# Patient Record
Sex: Male | Born: 1935 | Race: Black or African American | Hispanic: No | Marital: Married | Smoking: Never smoker
Health system: Southern US, Community
[De-identification: ages and names within clinical notes are randomized; demographics above are authoritative.]

## PROBLEM LIST (undated history)

## (undated) HISTORY — PX: APPENDECTOMY: SHX54

---

## 2005-02-01 ENCOUNTER — Ambulatory Visit: Payer: Self-pay | Admitting: Radiation Oncology

## 2005-02-25 ENCOUNTER — Ambulatory Visit: Payer: Self-pay | Admitting: Radiation Oncology

## 2005-03-28 ENCOUNTER — Ambulatory Visit: Payer: Self-pay | Admitting: Radiation Oncology

## 2005-04-25 ENCOUNTER — Other Ambulatory Visit: Payer: Self-pay

## 2005-04-25 ENCOUNTER — Ambulatory Visit: Payer: Self-pay | Admitting: Urology

## 2005-04-27 ENCOUNTER — Ambulatory Visit: Payer: Self-pay | Admitting: Radiation Oncology

## 2005-05-08 ENCOUNTER — Ambulatory Visit: Payer: Self-pay | Admitting: Radiation Oncology

## 2005-05-28 ENCOUNTER — Ambulatory Visit: Payer: Self-pay | Admitting: Radiation Oncology

## 2005-06-28 ENCOUNTER — Ambulatory Visit: Payer: Self-pay | Admitting: Radiation Oncology

## 2005-09-18 ENCOUNTER — Ambulatory Visit: Payer: Self-pay | Admitting: Radiation Oncology

## 2005-10-04 ENCOUNTER — Inpatient Hospital Stay: Payer: Self-pay | Admitting: Surgery

## 2006-03-19 ENCOUNTER — Ambulatory Visit: Payer: Self-pay | Admitting: Radiation Oncology

## 2006-03-28 ENCOUNTER — Ambulatory Visit: Payer: Self-pay | Admitting: Radiation Oncology

## 2007-03-20 ENCOUNTER — Ambulatory Visit: Payer: Self-pay | Admitting: Radiation Oncology

## 2007-03-29 ENCOUNTER — Ambulatory Visit: Payer: Self-pay | Admitting: Radiation Oncology

## 2008-02-26 ENCOUNTER — Ambulatory Visit: Payer: Self-pay | Admitting: Radiation Oncology

## 2008-03-18 ENCOUNTER — Ambulatory Visit: Payer: Self-pay | Admitting: Radiation Oncology

## 2008-03-28 ENCOUNTER — Ambulatory Visit: Payer: Self-pay | Admitting: Radiation Oncology

## 2008-08-12 ENCOUNTER — Ambulatory Visit: Payer: Self-pay | Admitting: Family Medicine

## 2008-09-24 ENCOUNTER — Ambulatory Visit: Payer: Self-pay | Admitting: Internal Medicine

## 2009-02-25 ENCOUNTER — Ambulatory Visit: Payer: Self-pay | Admitting: Radiation Oncology

## 2009-03-17 ENCOUNTER — Ambulatory Visit: Payer: Self-pay | Admitting: Radiation Oncology

## 2009-03-28 ENCOUNTER — Ambulatory Visit: Payer: Self-pay | Admitting: Radiation Oncology

## 2010-02-25 ENCOUNTER — Ambulatory Visit: Payer: Self-pay | Admitting: Radiation Oncology

## 2010-03-12 ENCOUNTER — Ambulatory Visit: Payer: Self-pay | Admitting: Radiation Oncology

## 2010-03-28 ENCOUNTER — Ambulatory Visit: Payer: Self-pay | Admitting: Radiation Oncology

## 2011-04-02 ENCOUNTER — Inpatient Hospital Stay: Payer: Self-pay | Admitting: Internal Medicine

## 2011-04-03 ENCOUNTER — Inpatient Hospital Stay (HOSPITAL_COMMUNITY)
Admission: RE | Admit: 2011-04-03 | Discharge: 2011-04-10 | DRG: 236 | Disposition: A | Discharge status: Home Health | Payer: Medicare Other | Source: Other Acute Inpatient Hospital | Attending: Thoracic Surgery (Cardiothoracic Vascular Surgery) | Admitting: Thoracic Surgery (Cardiothoracic Vascular Surgery)

## 2011-04-03 ENCOUNTER — Inpatient Hospital Stay (HOSPITAL_COMMUNITY): Payer: Medicare Other

## 2011-04-03 DIAGNOSIS — Z8546 Personal history of malignant neoplasm of prostate: Secondary | ICD-10-CM

## 2011-04-03 DIAGNOSIS — F05 Delirium due to known physiological condition: Secondary | ICD-10-CM | POA: Diagnosis not present

## 2011-04-03 DIAGNOSIS — E785 Hyperlipidemia, unspecified: Secondary | ICD-10-CM | POA: Diagnosis present

## 2011-04-03 DIAGNOSIS — D72829 Elevated white blood cell count, unspecified: Secondary | ICD-10-CM | POA: Diagnosis not present

## 2011-04-03 DIAGNOSIS — E8779 Other fluid overload: Secondary | ICD-10-CM | POA: Diagnosis present

## 2011-04-03 DIAGNOSIS — D62 Acute posthemorrhagic anemia: Secondary | ICD-10-CM | POA: Diagnosis not present

## 2011-04-03 DIAGNOSIS — I2510 Atherosclerotic heart disease of native coronary artery without angina pectoris: Principal | ICD-10-CM | POA: Diagnosis present

## 2011-04-03 DIAGNOSIS — Z7982 Long term (current) use of aspirin: Secondary | ICD-10-CM

## 2011-04-03 DIAGNOSIS — R000 Tachycardia, unspecified: Secondary | ICD-10-CM | POA: Diagnosis not present

## 2011-04-03 DIAGNOSIS — R1313 Dysphagia, pharyngeal phase: Secondary | ICD-10-CM | POA: Diagnosis not present

## 2011-04-03 DIAGNOSIS — Z01810 Encounter for preprocedural cardiovascular examination: Secondary | ICD-10-CM

## 2011-04-03 DIAGNOSIS — I209 Angina pectoris, unspecified: Secondary | ICD-10-CM | POA: Diagnosis present

## 2011-04-03 DIAGNOSIS — I10 Essential (primary) hypertension: Secondary | ICD-10-CM | POA: Diagnosis present

## 2011-04-03 DIAGNOSIS — Z79899 Other long term (current) drug therapy: Secondary | ICD-10-CM

## 2011-04-03 LAB — COMPREHENSIVE METABOLIC PANEL
ALT: 8 U/L (ref 0–53)
AST: 16 U/L (ref 0–37)
Albumin: 3.5 g/dL (ref 3.5–5.2)
Calcium: 9.3 mg/dL (ref 8.4–10.5)
Creatinine, Ser: 0.94 mg/dL (ref 0.4–1.5)
GFR calc Af Amer: >60 mL/min (ref >60)
GFR calc non Af Amer: >60 mL/min (ref >60)
Sodium: 138 mEq/L (ref 135–145)
Total Protein: 7 g/dL (ref 6.0–8.3)

## 2011-04-03 LAB — BLOOD GAS, ARTERIAL
Bicarbonate: 26.6 mEq/L — ABNORMAL HIGH (ref 20.0–24.0)
FIO2: 0.21 %
Patient temperature: 98.6
TCO2: 27.9 mmol/L (ref 0–100)
pCO2 arterial: 42 mmHg (ref 35.0–45.0)
pH, Arterial: 7.418 (ref 7.350–7.450)

## 2011-04-03 LAB — URINALYSIS, MICROSCOPIC ONLY
Bilirubin Urine: NEGATIVE
Ketones, ur: NEGATIVE mg/dL
Nitrite: NEGATIVE
Protein, ur: NEGATIVE mg/dL
pH: 5.5 (ref 5.0–8.0)

## 2011-04-03 LAB — PROTIME-INR
INR: 1.01 (ref 0.00–1.49)
Prothrombin Time: 13.5 seconds (ref 11.6–15.2)

## 2011-04-03 LAB — CBC
Platelets: 238 10*3/uL (ref 150–400)
RBC: 4.89 MIL/uL (ref 4.22–5.81)
RDW: 13 % (ref 11.5–15.5)
WBC: 11.4 10*3/uL — ABNORMAL HIGH (ref 4.0–10.5)

## 2011-04-03 LAB — DIFFERENTIAL
Basophils Absolute: 0 10*3/uL (ref 0.0–0.1)
Eosinophils Absolute: 0.1 10*3/uL (ref 0.0–0.7)
Eosinophils Relative: 1 % (ref 0–5)
Lymphocytes Relative: 15 % (ref 12–46)
Lymphs Abs: 1.7 10*3/uL (ref 0.7–4.0)
Neutrophils Relative %: 76 % (ref 43–77)

## 2011-04-03 LAB — HEMOGLOBIN A1C: Mean Plasma Glucose: 123 mg/dL — ABNORMAL HIGH (ref <117)

## 2011-04-03 LAB — APTT: aPTT: 36 seconds (ref 24–37)

## 2011-04-04 ENCOUNTER — Inpatient Hospital Stay (HOSPITAL_COMMUNITY): Payer: Medicare Other

## 2011-04-04 DIAGNOSIS — I2510 Atherosclerotic heart disease of native coronary artery without angina pectoris: Secondary | ICD-10-CM

## 2011-04-04 LAB — CBC
HCT: 31.2 % — ABNORMAL LOW (ref 39.0–52.0)
HCT: 32.1 % — ABNORMAL LOW (ref 39.0–52.0)
Hemoglobin: 10.8 g/dL — ABNORMAL LOW (ref 13.0–17.0)
Hemoglobin: 11.1 g/dL — ABNORMAL LOW (ref 13.0–17.0)
MCHC: 34.6 g/dL (ref 30.0–36.0)
MCV: 82.3 fL (ref 78.0–100.0)
RDW: 13.2 % (ref 11.5–15.5)
WBC: 15.3 10*3/uL — ABNORMAL HIGH (ref 4.0–10.5)

## 2011-04-04 LAB — HEMOGLOBIN AND HEMATOCRIT, BLOOD: Hemoglobin: 8.6 g/dL — ABNORMAL LOW (ref 13.0–17.0)

## 2011-04-04 LAB — POCT I-STAT, CHEM 8
BUN: 9 mg/dL (ref 6–23)
Calcium, Ion: 1.18 mmol/L (ref 1.12–1.32)
Chloride: 107 mEq/L (ref 96–112)
Creatinine, Ser: 1.1 mg/dL (ref 0.4–1.5)
Glucose, Bld: 138 mg/dL — ABNORMAL HIGH (ref 70–99)
Potassium: 4.4 mEq/L (ref 3.5–5.1)

## 2011-04-04 LAB — POCT I-STAT 3, ART BLOOD GAS (G3+)
Acid-base deficit: 4 mmol/L — ABNORMAL HIGH (ref 0.0–2.0)
Bicarbonate: 27.7 mEq/L — ABNORMAL HIGH (ref 20.0–24.0)
Bicarbonate: 21.7 mEq/L (ref 20.0–24.0)
O2 Saturation: 100 %
O2 Saturation: 99 %
TCO2: 23 mmol/L (ref 0–100)
pH, Arterial: 7.424 (ref 7.350–7.450)
pO2, Arterial: 235 mmHg — ABNORMAL HIGH (ref 80.0–100.0)
pO2, Arterial: 194 mmHg — ABNORMAL HIGH (ref 80.0–100.0)
pO2, Arterial: 165 mmHg — ABNORMAL HIGH (ref 80.0–100.0)

## 2011-04-04 LAB — POCT I-STAT 4, (NA,K, GLUC, HGB,HCT)
Glucose, Bld: 103 mg/dL — ABNORMAL HIGH (ref 70–99)
Glucose, Bld: 97 mg/dL (ref 70–99)
Glucose, Bld: 107 mg/dL — ABNORMAL HIGH (ref 70–99)
HCT: 26 % — ABNORMAL LOW (ref 39.0–52.0)
HCT: 26 % — ABNORMAL LOW (ref 39.0–52.0)
HCT: 24 % — ABNORMAL LOW (ref 39.0–52.0)
Hemoglobin: 10.5 g/dL — ABNORMAL LOW (ref 13.0–17.0)
Hemoglobin: 8.8 g/dL — ABNORMAL LOW (ref 13.0–17.0)
Hemoglobin: 10.5 g/dL — ABNORMAL LOW (ref 13.0–17.0)
Potassium: 3.3 mEq/L — ABNORMAL LOW (ref 3.5–5.1)
Potassium: 3.2 mEq/L — ABNORMAL LOW (ref 3.5–5.1)
Potassium: 4.5 mEq/L (ref 3.5–5.1)
Sodium: 135 mEq/L (ref 135–145)
Sodium: 136 mEq/L (ref 135–145)
Sodium: 137 mEq/L (ref 135–145)

## 2011-04-04 LAB — PROTIME-INR: INR: 1.48 (ref 0.00–1.49)

## 2011-04-04 LAB — APTT: aPTT: 39 seconds — ABNORMAL HIGH (ref 24–37)

## 2011-04-05 ENCOUNTER — Inpatient Hospital Stay (HOSPITAL_COMMUNITY): Payer: Medicare Other

## 2011-04-05 LAB — BASIC METABOLIC PANEL
BUN: 20 mg/dL (ref 6–23)
CO2: 22 mEq/L (ref 19–32)
CO2: 24 mEq/L (ref 19–32)
Calcium: 8.1 mg/dL — ABNORMAL LOW (ref 8.4–10.5)
Calcium: 8.9 mg/dL (ref 8.4–10.5)
Chloride: 101 mEq/L (ref 96–112)
Creatinine, Ser: 0.94 mg/dL (ref 0.4–1.5)
Creatinine, Ser: 1.35 mg/dL (ref 0.4–1.5)
GFR calc Af Amer: >60 mL/min (ref >60)
GFR calc non Af Amer: >60 mL/min (ref >60)
Glucose, Bld: 156 mg/dL — ABNORMAL HIGH (ref 70–99)
Sodium: 137 mEq/L (ref 135–145)

## 2011-04-05 LAB — CBC
HCT: 29.4 % — ABNORMAL LOW (ref 39.0–52.0)
Hemoglobin: 9.9 g/dL — ABNORMAL LOW (ref 13.0–17.0)
Hemoglobin: 10 g/dL — ABNORMAL LOW (ref 13.0–17.0)
MCH: 27.9 pg (ref 26.0–34.0)
MCH: 27.9 pg (ref 26.0–34.0)
MCHC: 33.8 g/dL (ref 30.0–36.0)
MCV: 82.1 fL (ref 78.0–100.0)
Platelets: 157 10*3/uL (ref 150–400)
Platelets: 168 10*3/uL (ref 150–400)
RBC: 3.55 MIL/uL — ABNORMAL LOW (ref 4.22–5.81)
RBC: 3.58 MIL/uL — ABNORMAL LOW (ref 4.22–5.81)
WBC: 21.8 10*3/uL — ABNORMAL HIGH (ref 4.0–10.5)

## 2011-04-05 LAB — GLUCOSE, CAPILLARY
Glucose-Capillary: 101 mg/dL — ABNORMAL HIGH (ref 70–99)
Glucose-Capillary: 104 mg/dL — ABNORMAL HIGH (ref 70–99)
Glucose-Capillary: 117 mg/dL — ABNORMAL HIGH (ref 70–99)
Glucose-Capillary: 119 mg/dL — ABNORMAL HIGH (ref 70–99)
Glucose-Capillary: 132 mg/dL — ABNORMAL HIGH (ref 70–99)
Glucose-Capillary: 81 mg/dL (ref 70–99)

## 2011-04-05 LAB — MAGNESIUM
Magnesium: 2.6 mg/dL — ABNORMAL HIGH (ref 1.5–2.5)
Magnesium: 2.8 mg/dL — ABNORMAL HIGH (ref 1.5–2.5)

## 2011-04-06 ENCOUNTER — Inpatient Hospital Stay (HOSPITAL_COMMUNITY): Payer: Medicare Other

## 2011-04-06 LAB — BASIC METABOLIC PANEL
Calcium: 8 mg/dL — ABNORMAL LOW (ref 8.4–10.5)
GFR calc Af Amer: >60 mL/min (ref >60)
GFR calc non Af Amer: 57 mL/min — ABNORMAL LOW (ref >60)
Potassium: 4.1 mEq/L (ref 3.5–5.1)
Sodium: 135 mEq/L (ref 135–145)

## 2011-04-06 LAB — CBC
Hemoglobin: 8.9 g/dL — ABNORMAL LOW (ref 13.0–17.0)
MCH: 28.3 pg (ref 26.0–34.0)
MCHC: 34.5 g/dL (ref 30.0–36.0)
Platelets: 130 10*3/uL — ABNORMAL LOW (ref 150–400)

## 2011-04-06 LAB — GLUCOSE, CAPILLARY: Glucose-Capillary: 117 mg/dL — ABNORMAL HIGH (ref 70–99)

## 2011-04-07 ENCOUNTER — Inpatient Hospital Stay (HOSPITAL_COMMUNITY): Payer: Medicare Other

## 2011-04-07 LAB — CROSSMATCH
Antibody Screen: NEGATIVE
Unit division: 0

## 2011-04-07 LAB — BASIC METABOLIC PANEL
CO2: 26 mEq/L (ref 19–32)
Calcium: 8.3 mg/dL — ABNORMAL LOW (ref 8.4–10.5)
Creatinine, Ser: 1.12 mg/dL (ref 0.4–1.5)
GFR calc Af Amer: >60 mL/min (ref >60)
GFR calc non Af Amer: >60 mL/min (ref >60)
Sodium: 141 mEq/L (ref 135–145)

## 2011-04-07 LAB — CBC
MCV: 81.6 fL (ref 78.0–100.0)
Platelets: 187 10*3/uL (ref 150–400)
RBC: 3.26 MIL/uL — ABNORMAL LOW (ref 4.22–5.81)
RDW: 13.8 % (ref 11.5–15.5)
WBC: 18.4 10*3/uL — ABNORMAL HIGH (ref 4.0–10.5)

## 2011-04-08 LAB — URINE CULTURE
Colony Count: NO GROWTH
Culture  Setup Time: 201206102033

## 2011-04-08 LAB — URINALYSIS, ROUTINE W REFLEX MICROSCOPIC
Leukocytes, UA: NEGATIVE
Nitrite: NEGATIVE
Specific Gravity, Urine: 1.029 (ref 1.005–1.030)
Urobilinogen, UA: 1 mg/dL (ref 0.0–1.0)

## 2011-04-08 LAB — URINE MICROSCOPIC-ADD ON

## 2011-04-08 LAB — CBC
HCT: 27.3 % — ABNORMAL LOW (ref 39.0–52.0)
MCH: 28.1 pg (ref 26.0–34.0)
MCV: 82.5 fL (ref 78.0–100.0)
RBC: 3.31 MIL/uL — ABNORMAL LOW (ref 4.22–5.81)
RDW: 13.8 % (ref 11.5–15.5)
WBC: 13.8 10*3/uL — ABNORMAL HIGH (ref 4.0–10.5)

## 2011-04-09 ENCOUNTER — Inpatient Hospital Stay (HOSPITAL_COMMUNITY): Payer: Medicare Other

## 2011-04-09 LAB — CBC
Platelets: 301 10*3/uL (ref 150–400)
RDW: 14.3 % (ref 11.5–15.5)
WBC: 12.5 10*3/uL — ABNORMAL HIGH (ref 4.0–10.5)

## 2011-04-09 LAB — BASIC METABOLIC PANEL
Chloride: 106 mEq/L (ref 96–112)
GFR calc Af Amer: >60 mL/min (ref >60)
Potassium: 3.7 mEq/L (ref 3.5–5.1)

## 2011-04-24 HISTORY — PX: CORONARY ARTERY BYPASS GRAFT: SHX141

## 2011-04-30 ENCOUNTER — Other Ambulatory Visit: Payer: Self-pay | Admitting: Thoracic Surgery (Cardiothoracic Vascular Surgery)

## 2011-04-30 ENCOUNTER — Ambulatory Visit: Payer: Self-pay | Admitting: Family Medicine

## 2011-04-30 DIAGNOSIS — I2510 Atherosclerotic heart disease of native coronary artery without angina pectoris: Secondary | ICD-10-CM

## 2011-04-30 NOTE — Discharge Summary (Signed)
Jimmy Horn, Jimmy Horn               ACCOUNT NO.:  [id number]  MEDICAL RECORD NO.:  [id number]  LOCATION:  2015                         FACILITY:  [facility]  PHYSICIAN:  Salvatore Decent. Dorris Fetch, M.D.DATE OF BIRTH:  1931/01/15  DATE OF ADMISSION:  04/03/2011 DATE OF DISCHARGE:                              DISCHARGE SUMMARY   HISTORY:  The patient is a 77 year old male with a 60-month history of chest tightness, which would occur with exertion and was relieved with rest.  His symptoms have increased in frequency over time and are associated with mild shortness of breath, decreased exercise tolerance, and fatigue.  He denies any diaphoresis and nausea or vomiting associated with these symptoms.  He saw his primary care physician, Dr. Letta Kocher in [facility] recently with these complaints and subsequently underwent a stress test, which showed septal and distal apical perfusion defects consistent with myocardial ischemia.  He was then referred to Dr. Arnoldo Hooker at the [facility] and was admitted to [facility] for cardiac catheterization.  This was done on April 02, 2011 and revealed a 99% distal left main stenosis with a 50% proximal LAD and a 75% additional proximal LAD lesion as well as a 60% mid-LAD lesion, a 75% ostial circumflex lesion, and a 99% proximal right coronary artery lesion.  He was found to have normal left ventricular function.  Because of the severity of the left main disease and other lesions, it was felt that surgical revascularization was his best option and he was transferred to [facility] under the care of Dr. Charlett Lango for coronary artery bypass grafting.  PAST MEDICAL HISTORY: 1. Hypertension. 2. Hyperlipidemia. 3. History of prostate cancer, status post radiation therapy.  PAST SURGICAL HISTORY:  Open appendectomy.  MEDICATIONS PRIOR TO ADMISSION: 1. Simvastatin 20 mg q.p.m. 2. Lisinopril/hydrochlorothiazide 20/25 mg p.o.  daily. 3. Aspirin 81 mg p.o. daily.  ALLERGIES:  No known drug allergies.  FAMILY HISTORY:  Remarkable for cancer in several of his brothers.  His mother deceased at age 32 from myocardial infarction.  SOCIAL HISTORY:  He is retired and resides with his [place]  He denies alcohol or tobacco use.  He did previously smoked an occasional cigar, but has not done this for several years.  REVIEW OF SYMPTOMS:  Please see the dictated history and physical done at the time of admission.  PHYSICAL EXAMINATION:  Please see the dictated history and physical done at the time of admission.  HOSPITAL COURSE:  The patient was admitted in transfer and seen by Dr. Dorris Fetch who agreed with recommendations to proceed with surgical revascularization.  PROCEDURE:  On April 04, 2011, he was taken to the cardiac operating room where he underwent the following procedure; coronary artery bypass grafting x3.  The following grafts were placed. 1. Left internal mammary artery to the LAD. 2. Saphenous vein graft to the distal right coronary artery. 3. Saphenous vein graft to the obtuse marginal #1.  Intraoperative     findings included the fact that the vein was relatively large, but     without discrete varicosities.  The distal right was a fair quality     target.  The LAD and obtuse marginal were good quality targets.     Mammary was good quality and he had normal cardiac size and     function.  POSTOPERATIVE HOSPITAL COURSE:  The patient has overall progressed nicely postoperatively.  He has remained hemodynamically stable.  He was weaned from the ventilator without difficulty.  All routine lines, monitors and drainage devices have been discontinued in the standard fashion.  He has had some occasional postoperative confusion, requiring intermittent Haldol use.  This is slowly improved and he currently is at his baseline neurological status with the exception of some  difficulty with swallowing.  He is being seen by Speech Pathology.  Currently, he is on a dysphagia III diet.  He is scheduled to undergo a modified barium swallow on today's date, April 09, 2011.  Pending this result, we will determine his dietary recommendations for discharge.  He has postoperative acute blood loss anemia, which has stabilized.  His most recent hemoglobin and hematocrit dated April 08, 2011 are 9.3 and 27.3 respectively.  Incisions are healing well without evidence of infection. He has had no significant cardiac dysrhythmias.  He has had some hypertension and his medications have been adjusted, being restarted on ACE inhibitor and his beta-blocker is being titrated for both high heart rate and blood pressure.  He did have a postoperative leukocytosis, but has remained afebrile with no evidence of infection.  His values are also improving.  His most recent white blood cell count is 13.8 on April 08, 2011.  Oxygen has been weaned and he maintained good saturations on room air.  He has a mild postoperative volume overload, which is responding well with diuretics, which will be continued short-term as an outpatient.  Currently, his status is felt to be tentatively stable for discharge in the morning of April 10, 2011, pending morning round reevaluation.  Medications at discharge include the following: 1. Aspirin enteric-coated tablet 325 mg p.o. daily. 2. Ensure pudding p.o. t.i.d. 3. Lasix 40 mg daily for 5 additional days. 4. Robitussin DM p.r.n. q.4 h. 5. Lisinopril 10 mg p.o. daily. 6. Lopressor 50 mg p.o. b.i.d. 7. Potassium chloride 20 mEq daily for an additional 5 days for pain. 8. Ultram 50 mg one to two every 4-6 hours as needed. 9. Simvastatin 20 mg p.o. daily.  INSTRUCTIONS:  The patient will receive written instructions regarding medications, activity, diet, wound care, and followup.  FOLLOWUP:  Dr. Dorris Fetch on May 02, 2011 at 1:15.  Additionally, he has  instructions to obtain appointment with Dr. Gwen Pounds, his cardiologist in 2 weeks postdischarge.  DIET ON DISCHARGE:  Recommendations to follow pending resolve of the modified barium swallow to be done today.  FINAL DIAGNOSIS:  Severe left main and three-vessel coronary artery disease, now status post surgical revascularization as described.  OTHER DIAGNOSES: 1. Postoperative acute blood loss anemia. 2. Postoperative volume overload. 3. History of hypertension. 4. History of hyperlipidemia. 5. History of prostate cancer, status post radiation therapy. 6. Postoperative delirium, resolved. 7. Postoperative swallowing dysfunction.  Diagnosis still being     evaluated.     Rowe Clack, P.A.-C.   ______________________________ Salvatore Decent Dorris Fetch, M.D.    Sherryll Burger  D:  04/09/2011  T:  04/09/2011  Job:  [id number]  cc:   Lillie Columbia, MD  Electronically Signed by Gershon Crane P.A.-C. on 04/10/2011 09:32:36 AM Electronically Signed by Charlett Lango M.D. on 04/30/2011 01:49:32 PM

## 2011-04-30 NOTE — Op Note (Signed)
Jimmy Horn, Jimmy Horn               ACCOUNT NO.:  [id number]  MEDICAL RECORD NO.:  [id number]  LOCATION:  2306                         FACILITY:  [facility]  PHYSICIAN:  Salvatore Decent. Dorris Fetch, M.D.DATE OF BIRTH:  November 05, 1930  DATE OF PROCEDURE:  04/04/2011 DATE OF DISCHARGE:                              OPERATIVE REPORT   PREOPERATIVE DIAGNOSIS:  Critical left main and 3-vessel disease with exertional angina.  POSTOPERATIVE DIAGNOSIS:  Critical left main and 3-vessel disease with exertional angina.  PROCEDURES:  Median sternotomy, extracorporeal circulation, coronary bypass grafting x3 (left internal mammary artery to LAD, saphenous vein graft to distal right coronary artery, saphenous vein graft to obtuse marginal 1), endoscopic vein harvest right leg.  SURGEON:  Salvatore Decent. Dorris Fetch, MD  ASSISTANT:  Stephanie Acre Dasovich, PA  ANESTHESIA:  General.  FINDINGS:  Vein relatively large, but without discreet varicosities. Distal right fair-quality target LAD, OM good-quality target, mammary good quality, normal cardiac size and function.  CLINICAL NOTE:  Jimmy Horn is a 77 year old gentleman who presented with history of exertional angina.  This was relieved with rest.  He had had no rest or nocturnal or other unstable symptoms.  He was seen by Dr. Arnoldo Hooker and had cardiac catheterization performed where he was found to have a 99% left main stenosis as well as almost subtotal occlusion of the proximal right coronary artery.  The patient was referred to [facility] for coronary artery bypass grafting.  The indications, risks, benefits and alternatives were discussed in detail with the patient.  The patient understood the risks include, but not were not limited to death, stroke, MI, DVT, PE bleeding, possible need for transfusions, infections as well as other organ system dysfunction, respiratory, renal or GI complications.  He understood and accepted these risks, and agreed to  proceed with surgery.  OPERATIVE NOTE:  Jimmy Horn was brought to brought to the preoperative holding area on April 04, 2011.  There, the Anesthesia Service placed a Swan-Ganz catheter and arterial blood pressure monitoring line. Additional intravenous access was established.  Intravenous antibiotics were administered.  He was taken to the operating room, anesthetized and intubated.  A Foley catheter was placed.  The chest, abdomen, and legs were prepped and draped in usual sterile fashion.  An incision was made in the medial aspect of the left leg at the level of the knee.  The greater saphenous vein was harvested from groin to the upper calf.  It was a large caliber vein approximately 7 mm in diameter.  There were no discrete varicosities and no areas of sclerosis.  Simultaneously with this, a median sternotomy was performed and the left internal mammary artery was harvested using standard technique.  Internal mammary artery was a good-quality vessel.  Two thousand units of heparin was administered during the vessel harvest and remaining full heparin dose was given prior to opening the pericardium.  The pericardium was opened confirming adequate anticoagulation with ACT measurement.  The ascending aorta was inspected.  There was no palpable atherosclerotic disease.  The aorta was cannulated via concentric 2-0 Ethilon pledgeted pursestring sutures.  A dual stage venous cannula placed via pursestring suture in the rectal appendage.  Cardiopulmonary  bypass was instituted and the patient was cooled to 32 degrees Celsius. The coronary arteries were inspected and anastomotic sites were chosen. The conduits were inspected and cut to length.  A foam pad was placed in the pericardium to insulate the heart and protect the left phrenic nerve.  A temperature probe was placed in myocardial septum and a cardioplegic cannula placed in the ascending aorta.  The aorta was crossclamped.  The left  ventricle was emptied via aortic root vent.  Cardiac arrest then was achieved with a combination of cold antegrade blood cardioplegia and topical iced saline.  One liter of cardioplegia was administered.  Myocardial septal temperature fell to 12 degrees Celsius.  Following, distal anastomoses were performed.  First, a reverse saphenous vein graft was placed end-to-side to the distal right coronary artery.  This was a 1.5-mm target vessel.  There was vein to arterial size mismatch.  The vein was anastomosed end-to- side with a running 7-0 Prolene suture.  At the completion of each distal anastomosis, it was probed proximally and distally to ensure patency before tying the suture.  Cardioplegia was administered down each vein graft to assess flow and hemostasis.  Vein graft down the right coronary vein was fair.  Next, a segment of reversed saphenous vein was placed end-to-side to obtuse marginal 1.  He also had some lateral plaquing at the site of anastomosis, but overall was a good-quality vessel.  A 1.5-mm probe passed easily proximally and distally.  The vein graft was anastomosed end-to-side with a running 7-0 Prolene suture.  Again, there was vein to arterial size mismatch due to the overall size of the vein. Cardioplegia was administered down the vein graft.  There was good hemostasis at both.  Next, the left internal mammary artery was brought through a window in the pericardium.  The distal end was beveled and anastomosed end-to-side to the distal LAD.  The LAD was a 1.5-mm good-quality target.  The mammary was a 1.5-mm good-quality conduit.  Anastomosis was performed end-to-side with a running 8-0 Prolene suture.  At completion of the mammary to LAD anastomosis, the bulldog clamp was briefly removed to inspect for hemostasis.  Immediate rapid septal rewarming was noted. The bulldog clamp was replaced and the mammary pedicle was tacked.  The epicardial surface was harvested  with 6-0 Prolene sutures.  Additional cardioplegia was administered.  The vein grafts were cut to length.  Cardioplegic cannula then was removed from the ascending aorta. The proximal vein graft anastomoses were performed to 5.0-mm punch aortotomies with running 6-0 Prolene sutures.  At completion of all proximal anastomosis, the patient was placed in Trendelenburg position. Lidocaine was administered.  The aortic root was de-aired.  Bulldog clamps again removed from left mammary artery and aortic crossclamp was removed.  Total crossclamp time was 61 minutes.  The patient required single defibrillation with 10 joules and then was in heart block after that.  While rewarming was completed, all proximal and distal anastomoses were inspect for hemostasis.  Epicardial pacing wires were placed on the right ventricle and right atrium.  DDD pacing was initiated.  When the patient rewarmed to a core temperature of 37 degrees Celsius, the lungs were reinflated and the patient was placed on ventilator.  He was weaned from cardiopulmonary bypass on the first attempt.  Total bypass time was 95 minutes.  Initial cardiac index was greater than 2 liters per minute per meter squared and the patient remained hemodynamically stable throughout the post bypass period.  The test dose of protamine was administered and was well tolerated.  The atrial and aortic cannulae were removed.  The remainder of protamine was administered without incident.  The chest was irrigated with warm saline.  Hemostasis was achieved.  The pericardium was reapproximated over the aorta.  The left pleural and single mediastinal chest tube were placed through separate subcostal incisions.  The sternum was closed with a combination of single and double heavy gauge stainless steel wires.  The pectoralis fascia, subcutaneous tissue, and skin were closed in standard fashion.  All sponge, needle and sponge counts were correct at the  end of procedure.  The patient was taken from the operating room to the surgical intensive care unit in good condition.     Salvatore Decent Dorris Fetch, M.D.     SCH/MEDQ  D:  04/04/2011  T:  04/05/2011  Job:  [id number]  cc:   Arnoldo Hooker, MD  Electronically Signed by Charlett Lango M.D. on 04/30/2011 01:49:24 PM

## 2011-04-30 NOTE — Discharge Summary (Signed)
  NAMEKENNIETH, PLOTTS               ACCOUNT NO.:  [id number]  MEDICAL RECORD NO.:  [id number]  LOCATION:  2017                         FACILITY:  [facility]  PHYSICIAN:  Salvatore Decent. Dorris Fetch, M.D.DATE OF BIRTH:  08-Nov-1930  DATE OF ADMISSION:  04/03/2011 DATE OF DISCHARGE:  04/10/2011                              DISCHARGE SUMMARY   ADDENDUM  Speech Pathology did a modified barium swallow on patient on April 09, 2011.  This demonstrated primary pharyngeal dysphagia resulting from anatomical differences.  They recommended the patient to continue on a D3 diet with honey-thick liquids and crushed meds.  They did come back and evaluate the patient on April 10, 2011, and discussed with the patient's wife and daughter on how to thicken to honey and where to buy Thick-it.  They also discussed and instructed wife and daughter on when and how to schedule outpatient MBS in 4-6 weeks.  This information was given to them by Speech Pathology.  On morning rounds of April 10, 2011, the patient was noted to be afebrile, in normal sinus rhythm.  Blood pressure better controlled.  O2 sats greater than 90% on room air.  All incisions are clean, dry, and intact and healing well.  Case Management did discuss with family possibility of skilled nursing facility versus home with home health.  It was felt that the patient would do better at home with home health with wife and son and daughter.  The patient is felt to be ready for discharge to home today, April 10, 2011.  Please see dictated discharge summary for followup appointments and discharge instructions as well as discharge medications.     Sol Blazing, PA   ______________________________ Salvatore Decent Dorris Fetch, M.D.    KMD/MEDQ  D:  04/10/2011  T:  04/11/2011  Job:  [id number]  Electronically Signed by Cameron Proud PA on 04/11/2011 10:31:43 AM Electronically Signed by Charlett Lango M.D. on 04/30/2011 01:49:35 PM

## 2011-04-30 NOTE — H&P (Signed)
Jimmy Horn, Jimmy Horn               ACCOUNT NO.:  [id number]  MEDICAL RECORD NO.:  [id number]  LOCATION:  2399                         FACILITY:  [facility]  PHYSICIAN:  Salvatore Decent. Dorris Fetch, M.D.DATE OF BIRTH:  [birth year]  DATE OF ADMISSION:  04/03/2011 DATE OF DISCHARGE:                             HISTORY & PHYSICAL   CHIEF COMPLAINT:  Chest pain.  REASON FOR ADMISSION:  Severe left main and three-vessel coronary artery disease.  HISTORY OF PRESENT ILLNESS:  The patient is a 77 year old male with a 4- month history of chest tightness, which occurs with exertion and is relieved with rest.  His symptoms have increased in frequency over time and are associated with mild shortness of breath, decreased exercise tolerance, and fatigue.  He denies any diaphoresis, nausea or vomiting associated with these symptoms.  He saw his primary care physician, Dr. Letta Kocher in [facility] recently with these complaints and subsequently underwent a stress test, which showed septal and distal apical perfusion defects consistent with myocardial ischemia.  He was then referred to Dr. Arnoldo Hooker at [facility] and was admitted to [facility] for cardiac catheterization.  A catheterization was performed on April 02, 2011 and revealed a 99% distal left main stenosis along with 50% proximal LAD and a second 75% proximal LAD lesion, 60% mid LAD, 75% ostial circumflex, 99% proximal right coronary artery, and normal left ventricular function.  Because of his severe left main disease, it was felt that coronary artery bypass grafting would be his best option, and he was then transferred to [facility] under the care of Dr. Charlett Lango for cardiac surgery evaluation for potential CABG.  PAST MEDICAL HISTORY: 1. Hypertension. 2. Hyperlipidemia. 3. History of prostate cancer, status post radiation therapy.  PAST SURGICAL HISTORY:  Open appendectomy.  HOME MEDICATIONS: 1.  Simvastatin 20 mg nightly. 2. Lisinopril/hydrochlorothiazide 20/25 mg daily. 3. Aspirin 81 mg daily.  ALLERGIES:  No known drug allergies.  FAMILY HISTORY:  His mother died of an MI at age 50.  He also has a history of cancer in several brothers.  SOCIAL HISTORY:  He is retired and resides with his wife in [place].  He denies alcohol or tobacco use, although he previously smoked cigars occasionally, but has not been done from many years.  REVIEW OF SYSTEMS:  The patient did experience one episode of dizziness recently which resolved without intervention.  He did mention to his primary care physician during his workup for the chest pain and was told that this was nothing to worry about.  Otherwise, he denies fevers, chills, weight loss, recent infections, visual changes, TIA symptoms, amaurosis fugax, syncope, heart palpitations, cough, shortness of breath, dyspnea on exertion, orthopnea, PND, hemoptysis, sputum production, abdominal pain, nausea, vomiting, diarrhea, constipation,hematemesis, hematochezia, melena, dysuria, nocturia, or hematuria, lower extremity edema, lower extremity claudication symptoms, lower extremity ulcers, muscle pain, or weakness, anxiety, depression, intolerance to heat or cold.  PHYSICAL EXAMINATION:  VITAL SIGNS:  Blood pressure is 149/76, pulse is 70 and regular, respirations 18 and unlabored, temperature 97.8, O2 sat 97% on room air. GENERAL:  This is a well-developed, well-nourished elderly male in no acute distress. HEENT:  Normocephalic, atraumatic.  Pupils are equal, round and reactive to light and accommodation.  Extraocular movements intact.  Exam of the external ears and nose reveal no abnormalities.  Oropharynx is clear with poor dentition.  Moist mucous membranes. NECK:  Supple without lymphadenopathy, thyromegaly, or carotid bruits. HEART:  Regular rate and rhythm without murmurs, rubs, or gallops. LUNGS:  Clear to auscultation. ABDOMEN:   Soft, nontender, and nondistended with active bowel sounds in all quadrants.  No masses or hepatosplenomegaly.  He has a well-healed scar at the umbilicus secondary to previous appendectomy. EXTREMITIES:  No clubbing, cyanosis or edema.  He has a right femoral percutaneous closure device in place from his catheterization with no surrounding hematoma or erythema.  His left femoral pulse is faintly palpable and he has faintly palpable posterior tibial pulses and unable to palpate a dorsalis pedis pulses well.  There are multiple superficial spider varicosities in bilateral lower extremities. NEUROLOGIC:  Cranial nerves II through XII grossly intact.  He is alert and oriented x3.  ASSESSMENT AND PLAN:  This is a 77 year old male with severe three- vessel coronary artery disease including 99% left main.  Dr. Dorris Fetch will see the patient AND review his films and it is likely that he will require coronary artery bypass grafting.  We have put him on the operating schedule for April 04, 2011.     Coral Ceo, P.A.   ______________________________ Salvatore Decent Dorris Fetch, M.D.  GC/MEDQ  D:  04/03/2011  T:  04/04/2011  Job:  [id number]  cc:   Lillie Columbia, MD  Electronically Signed by Coral Ceo P.A. on 04/25/2011 12:56:48 PM Electronically Signed by Charlett Lango M.D. on 04/30/2011 01:49:02 PM

## 2011-05-02 ENCOUNTER — Ambulatory Visit (HOSPITAL_COMMUNITY)
Admission: RE | Admit: 2011-05-02 | Discharge: 2011-05-02 | Disposition: A | Discharge status: Home / Self Care | Payer: Medicare Other | Source: Ambulatory Visit | Attending: Thoracic Surgery (Cardiothoracic Vascular Surgery) | Admitting: Thoracic Surgery (Cardiothoracic Vascular Surgery)

## 2011-05-02 ENCOUNTER — Ambulatory Visit (INDEPENDENT_AMBULATORY_CARE_PROVIDER_SITE_OTHER): Payer: Self-pay | Admitting: Thoracic Surgery (Cardiothoracic Vascular Surgery)

## 2011-05-02 ENCOUNTER — Encounter: Payer: Self-pay | Admitting: Thoracic Surgery (Cardiothoracic Vascular Surgery)

## 2011-05-02 DIAGNOSIS — R079 Chest pain, unspecified: Secondary | ICD-10-CM | POA: Insufficient documentation

## 2011-05-02 DIAGNOSIS — C61 Malignant neoplasm of prostate: Secondary | ICD-10-CM

## 2011-05-02 DIAGNOSIS — I10 Essential (primary) hypertension: Secondary | ICD-10-CM

## 2011-05-02 DIAGNOSIS — J90 Pleural effusion, not elsewhere classified: Secondary | ICD-10-CM | POA: Insufficient documentation

## 2011-05-02 DIAGNOSIS — I2510 Atherosclerotic heart disease of native coronary artery without angina pectoris: Secondary | ICD-10-CM

## 2011-05-02 DIAGNOSIS — Z09 Encounter for follow-up examination after completed treatment for conditions other than malignant neoplasm: Secondary | ICD-10-CM

## 2011-05-02 DIAGNOSIS — E7800 Pure hypercholesterolemia, unspecified: Secondary | ICD-10-CM

## 2011-05-02 NOTE — Progress Notes (Signed)
Mr Jimmy Horn a 77 year old gentleman who presented with chest tightness with exertion a cardiac catheterization he was found to have severe left main and three-vessel coronary disease. He underwent coronary artery bypass grafting x3 on June 7 of this year postoperatively he was noted have some confusion and difficulty with swallowing. In talking with he and his family his swallowing difficulties actually predated his surgery. He was seen by speech pathology and noted to have aspiration. He is currently using Thick-It to thicken his liquids. He has a followup MBS as an outpatient in a couple of weeks.  Mr. Grieco states that he is having only mild discomfort, he is not having to use narcotics. His exercise tolerance is good, and he has not had any recurrent angina. He also denies shortness of breath and wheezing he initially had some peripheral edema but has improved.  On exam he is a 77 year old gentleman in no acute distress. His neurologic exam is nonfocal. His cardiac exam has a regular rate and rhythm with no rubs or murmurs. Lungs have diminished breath sounds at the bases left greater than right. His sternal incision is clean dry and intact, his sternum is stable and his leg incisions are healing well, there is trace peripheral edema bilaterally.  His chest x-ray shows elevation of the left hemidiaphragm which was present prior to his surgery. There are small bilateral pleural effusions, but not clinically significant.  Impression.  Mr. Gosse is doing well at this point in time approximately a month postoperatively. He feels well he is not requiring narcotics and his exercise tolerance is improving.  He has seen Dr. Letta Kocher is not yet seen Dr. Gwen Pounds since his discharge from the hospital. I instructed him to call Dr. Philemon Kingdom office and schedule an appointment in the next couple weeks.  From a surgical standpoint he's feeling well. He is not lifting objects greater than 10 pounds for  another 2 weeks and nothing over 20 pounds for another 4 week period  He may begin driving very limited based, appropriate precautions were discussed with the patient and his family. He is to avoid high speeds heavy traffic and congested areas for the next several weeks.  At this point in time Mr. Rosenbloom does not have any other surgical issues, I would be happy to see him back any time if I can be of any further assistance with his care

## 2011-06-26 ENCOUNTER — Emergency Department: Payer: Self-pay | Admitting: Emergency Medicine

## 2011-08-19 ENCOUNTER — Other Ambulatory Visit: Payer: Self-pay | Admitting: Neurology

## 2011-08-19 DIAGNOSIS — I10 Essential (primary) hypertension: Secondary | ICD-10-CM

## 2011-08-19 DIAGNOSIS — I2510 Atherosclerotic heart disease of native coronary artery without angina pectoris: Secondary | ICD-10-CM

## 2011-08-19 DIAGNOSIS — G3183 Dementia with Lewy bodies: Secondary | ICD-10-CM

## 2011-08-24 ENCOUNTER — Other Ambulatory Visit: Payer: Medicare Other

## 2011-08-31 ENCOUNTER — Ambulatory Visit
Admission: RE | Admit: 2011-08-31 | Discharge: 2011-08-31 | Disposition: A | Discharge status: Home / Self Care | Payer: Medicare Other | Source: Ambulatory Visit | Attending: Neurology | Admitting: Neurology

## 2011-08-31 DIAGNOSIS — I10 Essential (primary) hypertension: Secondary | ICD-10-CM

## 2011-08-31 DIAGNOSIS — F0280 Dementia in other diseases classified elsewhere without behavioral disturbance: Secondary | ICD-10-CM

## 2011-08-31 DIAGNOSIS — G3183 Dementia with Lewy bodies: Secondary | ICD-10-CM

## 2011-08-31 DIAGNOSIS — I2510 Atherosclerotic heart disease of native coronary artery without angina pectoris: Secondary | ICD-10-CM

## 2011-12-01 ENCOUNTER — Emergency Department: Payer: Self-pay | Admitting: Emergency Medicine

## 2012-01-10 ENCOUNTER — Other Ambulatory Visit: Payer: Self-pay | Admitting: Neurology

## 2012-01-10 LAB — COMPREHENSIVE METABOLIC PANEL
BUN: 11 mg/dL (ref 7–18)
Chloride: 103 mmol/L (ref 98–107)
Co2: 29 mmol/L (ref 21–32)
EGFR (African American): >60
EGFR (Non-African Amer.): >60
SGOT(AST): 24 U/L (ref 15–37)
SGPT (ALT): 17 U/L
Total Protein: 8 g/dL (ref 6.4–8.2)

## 2012-01-10 LAB — TSH: Thyroid Stimulating Horm: 0.904 u[IU]/mL

## 2012-08-05 ENCOUNTER — Ambulatory Visit: Payer: Self-pay | Admitting: Ophthalmology

## 2014-06-01 ENCOUNTER — Ambulatory Visit: Payer: Self-pay | Admitting: Ophthalmology

## 2014-11-21 ENCOUNTER — Emergency Department: Payer: Self-pay | Admitting: Emergency Medicine

## 2014-11-21 LAB — CBC WITH DIFFERENTIAL/PLATELET
Basophil #: 0 10*3/uL (ref 0.0–0.1)
Basophil %: 0.3 %
EOS ABS: 0.1 10*3/uL (ref 0.0–0.7)
EOS PCT: 1.1 %
HCT: 39.3 % — AB (ref 40.0–52.0)
HGB: 13 g/dL (ref 13.0–18.0)
LYMPHS PCT: 15.9 %
Lymphocyte #: 1.7 10*3/uL (ref 1.0–3.6)
MCH: 28.4 pg (ref 26.0–34.0)
MCHC: 33.2 g/dL (ref 32.0–36.0)
MCV: 86 fL (ref 80–100)
MONO ABS: 0.6 x10 3/mm (ref 0.2–1.0)
Monocyte %: 5.4 %
NEUTROS PCT: 77.3 %
Neutrophil #: 8.1 10*3/uL — ABNORMAL HIGH (ref 1.4–6.5)
PLATELETS: 252 10*3/uL (ref 150–440)
RBC: 4.59 10*6/uL (ref 4.40–5.90)
RDW: 13.6 % (ref 11.5–14.5)
WBC: 10.5 10*3/uL (ref 3.8–10.6)

## 2014-11-21 LAB — URINALYSIS, COMPLETE
Bacteria: NONE SEEN
Bilirubin,UR: NEGATIVE
Blood: NEGATIVE
Glucose,UR: NEGATIVE mg/dL (ref 0–75)
Ketone: NEGATIVE
LEUKOCYTE ESTERASE: NEGATIVE
NITRITE: NEGATIVE
PROTEIN: NEGATIVE
Ph: 5 (ref 4.5–8.0)
Specific Gravity: 1.021 (ref 1.003–1.030)
Squamous Epithelial: <1

## 2014-11-21 LAB — COMPREHENSIVE METABOLIC PANEL
ALK PHOS: 68 U/L
ANION GAP: 7 (ref 7–16)
Albumin: 3.3 g/dL — ABNORMAL LOW (ref 3.4–5.0)
BILIRUBIN TOTAL: 0.8 mg/dL (ref 0.2–1.0)
BUN: 24 mg/dL — ABNORMAL HIGH (ref 7–18)
CHLORIDE: 108 mmol/L — AB (ref 98–107)
CO2: 28 mmol/L (ref 21–32)
CREATININE: 1.11 mg/dL (ref 0.60–1.30)
Calcium, Total: 8.6 mg/dL (ref 8.5–10.1)
Glucose: 117 mg/dL — ABNORMAL HIGH (ref 65–99)
OSMOLALITY: 290 (ref 275–301)
Potassium: 3.9 mmol/L (ref 3.5–5.1)
SGOT(AST): 24 U/L (ref 15–37)
SGPT (ALT): 13 U/L — ABNORMAL LOW
SODIUM: 143 mmol/L (ref 136–145)
Total Protein: 7.1 g/dL (ref 6.4–8.2)

## 2015-04-27 ENCOUNTER — Other Ambulatory Visit: Payer: Self-pay | Admitting: Neurology

## 2015-04-27 DIAGNOSIS — R413 Other amnesia: Secondary | ICD-10-CM

## 2015-04-27 DIAGNOSIS — R4689 Other symptoms and signs involving appearance and behavior: Secondary | ICD-10-CM

## 2015-05-02 ENCOUNTER — Ambulatory Visit
Admission: RE | Admit: 2015-05-02 | Discharge: 2015-05-02 | Disposition: A | Discharge status: Home / Self Care | Payer: Commercial Managed Care - HMO | Source: Ambulatory Visit | Attending: Neurology | Admitting: Neurology

## 2015-05-05 ENCOUNTER — Ambulatory Visit: Payer: Commercial Managed Care - HMO

## 2015-05-12 ENCOUNTER — Ambulatory Visit
Admission: RE | Admit: 2015-05-12 | Discharge: 2015-05-12 | Disposition: A | Discharge status: Home / Self Care | Payer: Commercial Managed Care - HMO | Source: Ambulatory Visit | Attending: Neurology | Admitting: Neurology

## 2015-05-12 DIAGNOSIS — G319 Degenerative disease of nervous system, unspecified: Secondary | ICD-10-CM | POA: Diagnosis not present

## 2015-05-12 DIAGNOSIS — R413 Other amnesia: Secondary | ICD-10-CM | POA: Diagnosis present

## 2015-05-12 DIAGNOSIS — I6782 Cerebral ischemia: Secondary | ICD-10-CM | POA: Insufficient documentation

## 2015-05-12 DIAGNOSIS — R4689 Other symptoms and signs involving appearance and behavior: Secondary | ICD-10-CM

## 2015-05-28 ENCOUNTER — Emergency Department: Payer: Commercial Managed Care - HMO

## 2015-05-28 ENCOUNTER — Inpatient Hospital Stay
Admission: EM | Admit: 2015-05-28 | Discharge: 2015-06-01 | DRG: 683 | Disposition: A | Discharge status: Home / Self Care | Payer: Commercial Managed Care - HMO | Attending: Internal Medicine | Admitting: Internal Medicine

## 2015-05-28 DIAGNOSIS — N170 Acute kidney failure with tubular necrosis: Secondary | ICD-10-CM | POA: Diagnosis present

## 2015-05-28 DIAGNOSIS — W19XXXA Unspecified fall, initial encounter: Secondary | ICD-10-CM | POA: Diagnosis present

## 2015-05-28 DIAGNOSIS — I2510 Atherosclerotic heart disease of native coronary artery without angina pectoris: Secondary | ICD-10-CM | POA: Diagnosis present

## 2015-05-28 DIAGNOSIS — Z79899 Other long term (current) drug therapy: Secondary | ICD-10-CM

## 2015-05-28 DIAGNOSIS — E441 Mild protein-calorie malnutrition: Secondary | ICD-10-CM | POA: Diagnosis present

## 2015-05-28 DIAGNOSIS — Z9049 Acquired absence of other specified parts of digestive tract: Secondary | ICD-10-CM | POA: Diagnosis present

## 2015-05-28 DIAGNOSIS — Z515 Encounter for palliative care: Secondary | ICD-10-CM | POA: Diagnosis not present

## 2015-05-28 DIAGNOSIS — N179 Acute kidney failure, unspecified: Secondary | ICD-10-CM | POA: Diagnosis present

## 2015-05-28 DIAGNOSIS — Z66 Do not resuscitate: Secondary | ICD-10-CM | POA: Diagnosis not present

## 2015-05-28 DIAGNOSIS — R1310 Dysphagia, unspecified: Secondary | ICD-10-CM | POA: Diagnosis present

## 2015-05-28 DIAGNOSIS — Z6822 Body mass index (BMI) 22.0-22.9, adult: Secondary | ICD-10-CM

## 2015-05-28 DIAGNOSIS — I959 Hypotension, unspecified: Secondary | ICD-10-CM | POA: Diagnosis present

## 2015-05-28 DIAGNOSIS — M109 Gout, unspecified: Secondary | ICD-10-CM | POA: Diagnosis not present

## 2015-05-28 DIAGNOSIS — Z951 Presence of aortocoronary bypass graft: Secondary | ICD-10-CM

## 2015-05-28 DIAGNOSIS — F0280 Dementia in other diseases classified elsewhere without behavioral disturbance: Secondary | ICD-10-CM | POA: Diagnosis present

## 2015-05-28 DIAGNOSIS — G309 Alzheimer's disease, unspecified: Secondary | ICD-10-CM | POA: Diagnosis not present

## 2015-05-28 DIAGNOSIS — Z809 Family history of malignant neoplasm, unspecified: Secondary | ICD-10-CM | POA: Diagnosis not present

## 2015-05-28 DIAGNOSIS — Z8249 Family history of ischemic heart disease and other diseases of the circulatory system: Secondary | ICD-10-CM

## 2015-05-28 DIAGNOSIS — Z794 Long term (current) use of insulin: Secondary | ICD-10-CM | POA: Diagnosis not present

## 2015-05-28 DIAGNOSIS — I10 Essential (primary) hypertension: Secondary | ICD-10-CM | POA: Diagnosis not present

## 2015-05-28 DIAGNOSIS — N281 Cyst of kidney, acquired: Secondary | ICD-10-CM | POA: Diagnosis not present

## 2015-05-28 DIAGNOSIS — E785 Hyperlipidemia, unspecified: Secondary | ICD-10-CM | POA: Diagnosis not present

## 2015-05-28 DIAGNOSIS — E860 Dehydration: Secondary | ICD-10-CM | POA: Diagnosis present

## 2015-05-28 LAB — CBC WITH DIFFERENTIAL/PLATELET
Basophils Absolute: 0 10*3/uL (ref 0–0.1)
Basophils Relative: 0 %
EOS PCT: 0 %
Eosinophils Absolute: 0 10*3/uL (ref 0–0.7)
HCT: 36.9 % — ABNORMAL LOW (ref 40.0–52.0)
Hemoglobin: 12.2 g/dL — ABNORMAL LOW (ref 13.0–18.0)
LYMPHS PCT: 11 %
Lymphs Abs: 1.3 10*3/uL (ref 1.0–3.6)
MCH: 28.3 pg (ref 26.0–34.0)
MCHC: 33.1 g/dL (ref 32.0–36.0)
MCV: 85.4 fL (ref 80.0–100.0)
MONO ABS: 0.9 10*3/uL (ref 0.2–1.0)
Monocytes Relative: 8 %
Neutro Abs: 9.6 10*3/uL — ABNORMAL HIGH (ref 1.4–6.5)
Neutrophils Relative %: 81 %
Platelets: 189 10*3/uL (ref 150–440)
RBC: 4.32 MIL/uL — ABNORMAL LOW (ref 4.40–5.90)
RDW: 14.7 % — ABNORMAL HIGH (ref 11.5–14.5)
WBC: 11.9 10*3/uL — ABNORMAL HIGH (ref 3.8–10.6)

## 2015-05-28 LAB — COMPREHENSIVE METABOLIC PANEL
ALK PHOS: 52 U/L (ref 38–126)
ALT: 10 U/L — AB (ref 17–63)
ANION GAP: 10 (ref 5–15)
AST: 20 U/L (ref 15–41)
Albumin: 3.7 g/dL (ref 3.5–5.0)
BILIRUBIN TOTAL: 1.3 mg/dL — AB (ref 0.3–1.2)
BUN: 43 mg/dL — AB (ref 6–20)
CO2: 26 mmol/L (ref 22–32)
Calcium: 9.1 mg/dL (ref 8.9–10.3)
Chloride: 107 mmol/L (ref 101–111)
Creatinine, Ser: 3.08 mg/dL — ABNORMAL HIGH (ref 0.61–1.24)
GFR, EST AFRICAN AMERICAN: 20 mL/min — AB (ref >60)
GFR, EST NON AFRICAN AMERICAN: 17 mL/min — AB (ref >60)
Glucose, Bld: 112 mg/dL — ABNORMAL HIGH (ref 65–99)
POTASSIUM: 3.9 mmol/L (ref 3.5–5.1)
SODIUM: 143 mmol/L (ref 135–145)
Total Protein: 6.4 g/dL — ABNORMAL LOW (ref 6.5–8.1)

## 2015-05-28 LAB — LACTIC ACID, PLASMA: Lactic Acid, Venous: 1.9 mmol/L (ref 0.5–2.0)

## 2015-05-28 MED ORDER — ACETAMINOPHEN 650 MG RE SUPP
650.0000 mg | Freq: Four times a day (QID) | RECTAL | Status: DC | PRN
Start: 2015-05-28 — End: 2015-06-01

## 2015-05-28 MED ORDER — SODIUM CHLORIDE 0.9 % IV BOLUS (SEPSIS)
1000.0000 mL | INTRAVENOUS | Status: AC
  Administered 2015-05-28: 1000 mL via INTRAVENOUS

## 2015-05-28 MED ORDER — ACETAMINOPHEN 325 MG PO TABS: 650.0000 mg | ORAL_TABLET | Freq: Four times a day (QID) | ORAL | Status: DC | PRN

## 2015-05-28 MED ORDER — SODIUM CHLORIDE 0.9 % IV SOLN
INTRAVENOUS | Status: DC
  Administered 2015-05-29 – 2015-05-31 (×5): via INTRAVENOUS

## 2015-05-28 MED ORDER — TETANUS-DIPHTHERIA TOXOIDS TD 5-2 LFU IM INJ
0.5000 mL | INJECTION | Freq: Once | INTRAMUSCULAR | Status: AC
  Administered 2015-05-28: 0.5 mL via INTRAMUSCULAR
  Filled 2015-05-28: qty 0.5

## 2015-05-28 MED ORDER — HEPARIN SODIUM (PORCINE) 5000 UNIT/ML IJ SOLN
5000.0000 [IU] | Freq: Three times a day (TID) | INTRAMUSCULAR | Status: DC
  Administered 2015-05-28 – 2015-06-01 (×7): 5000 [IU] via SUBCUTANEOUS
  Filled 2015-05-28 (×7): qty 1

## 2015-05-28 MED ORDER — ONDANSETRON HCL 4 MG PO TABS: 4.0000 mg | ORAL_TABLET | Freq: Four times a day (QID) | ORAL | Status: DC | PRN

## 2015-05-28 MED ORDER — ONDANSETRON HCL 4 MG/2ML IJ SOLN: 4.0000 mg | Freq: Four times a day (QID) | INTRAMUSCULAR | Status: DC | PRN

## 2015-05-28 MED ORDER — MORPHINE SULFATE 2 MG/ML IJ SOLN
2.0000 mg | INTRAMUSCULAR | Status: DC | PRN
  Administered 2015-06-01 (×2): 2 mg via INTRAVENOUS
  Filled 2015-05-28 (×2): qty 1

## 2015-05-28 MED ORDER — SODIUM CHLORIDE 0.9 % IV BOLUS (SEPSIS): 500.0000 mL | INTRAVENOUS | Status: DC

## 2015-05-28 NOTE — H&P (Signed)
[facility] NAME: Jimmy Horn    MR#:  [id number]  DATE OF BIRTH:  Sep 15, 1931   DATE OF ADMISSION:  05/28/2015  PRIMARY CARE PHYSICIAN: FELDPAUSCH, Chrissie Noa, MD   REQUESTING/REFERRING PHYSICIAN: Schaevitz  CHIEF COMPLAINT:   Chief Complaint  Patient presents with  . Fall  . Hypotension    HISTORY OF PRESENT ILLNESS:  Jimmy Horn  is a 81 y.o. male with a known history of dementia, essential hypertension presenting with falls. He is unable to provide any information given mental status baseline history obtained from wife and daughter at bedside. They state that he suffered 2 falls today, this is not extraordinarily rare for him, but they called EMS who noted him to be hypotensive thus brought him to the hospital for further workup and evaluation. They deny any preceding symptoms or illnesses. They deny any loss of consciousness witnessed seizure activity loss of bowel or bladder function. They do attests to some poor by mouth intake.  PAST MEDICAL HISTORY:  Dementia, essential hypertension, hyperlipidemia unspecified, coronary artery disease post CABG PAST SURGICAL HISTORY:   Past Surgical History  Procedure Laterality Date  . Coronary artery bypass graft  04/24/2011    Dr. Roxan Hockey  . Appendectomy      SOCIAL HISTORY:   History  Substance Use Topics  . Smoking status: Never Smoker   . Smokeless tobacco: Not on file  . Alcohol Use: No    FAMILY HISTORY:   Family History  Problem Relation Age of Onset  . Heart disease Mother     died of MI at age 42  . Cancer Brother   . Cancer Brother     DRUG ALLERGIES:  No Known Allergies  REVIEW OF SYSTEMS:  Unable to obtain given patient's mental status/medical condition   MEDICATIONS AT HOME:   Prior to Admission medications   Medication Sig Start Date End Date Taking? Authorizing Provider  aspirin EC 81 MG tablet Take 81 mg by mouth daily.   Yes  Historical Provider, MD  colchicine 0.6 MG tablet Take 0.6 mg by mouth daily.   Yes Historical Provider, MD  donepezil (ARICEPT) 10 MG tablet Take 10 mg by mouth at bedtime.   Yes Historical Provider, MD  lisinopril-hydrochlorothiazide (PRINZIDE,ZESTORETIC) 20-12.5 MG per tablet Take 1 tablet by mouth daily.   Yes Historical Provider, MD  pravastatin (PRAVACHOL) 40 MG tablet Take 40 mg by mouth daily.   Yes Historical Provider, MD  QUEtiapine (SEROQUEL) 25 MG tablet Take 25 mg by mouth at bedtime.   Yes Historical Provider, MD      VITAL SIGNS:  Blood pressure 113/59, pulse 63, temperature 98.1 F (36.7 C), temperature source Oral, resp. rate 10, height 5\' 9"  (1.753 m), weight 150 lb (68.04 kg), SpO2 100 %.  PHYSICAL EXAMINATION:  VITAL SIGNS: Filed Vitals:   05/28/15 2208  BP: 113/59  Pulse: 63  Temp:   Resp: 10   GENERAL:81 y.o.male currently in no acute distress.  HEAD: Normocephalic, atraumatic.  EYES: Pupils equal, round, reactive to light. Extraocular muscles intact. No scleral icterus.  MOUTH: Dry mucosal membrane. Dentition poor. No abscess noted.  EAR, NOSE, THROAT: Clear without exudates. No external lesions.  NECK: Supple. No thyromegaly. No nodules. No JVD.  PULMONARY: Clear to ascultation, without wheeze rails or rhonci. No use of accessory muscles, Good respiratory effort. good air entry bilaterally CHEST: Nontender to palpation.  CARDIOVASCULAR: S1 and S2. Regular rate and  rhythm. No murmurs, rubs, or gallops. No edema. Pedal pulses 2+ bilaterally.  GASTROINTESTINAL: Soft, nontender, nondistended. No masses. Positive bowel sounds. No hepatosplenomegaly.  MUSCULOSKELETAL: No swelling, clubbing, or edema. Range of motion full in all extremities.  NEUROLOGIC: Cranial nerves II through XII are intact. No gross focal neurological deficits. Sensation intact. Reflexes intact.  SKIN: No ulceration, lesions, rashes, or cyanosis. Skin warm and dry. Turgor intact.  PSYCHIATRIC:  Mood, affect within normal limits. The patient is awake, alert and oriented x 3. Insight, judgment intact.    LABORATORY PANEL:   CBC  Recent Labs Lab 05/28/15 1933  WBC 11.9*  HGB 12.2*  HCT 36.9*  PLT 189   ------------------------------------------------------------------------------------------------------------------  Chemistries   Recent Labs Lab 05/28/15 1933  NA 143  K 3.9  CL 107  CO2 26  GLUCOSE 112*  BUN 43*  CREATININE 3.08*  CALCIUM 9.1  AST 20  ALT 10*  ALKPHOS 52  BILITOT 1.3*   ------------------------------------------------------------------------------------------------------------------  Cardiac Enzymes No results for input(s): TROPONINI in the last 168 hours. ------------------------------------------------------------------------------------------------------------------  RADIOLOGY:  Dg Pelvis 1-2 Views  05/28/2015   CLINICAL DATA:  Unwitnessed fall.  Altered mental status.  EXAM: PELVIS - 1-2 VIEW  COMPARISON:  None.  FINDINGS: The cortical margins of the bony pelvis are intact. No fracture. Pubic symphysis and sacroiliac joints are congruent. Both femoral heads are well-seated in the respective acetabula. Multifocal of the left optic change. The bones are under mineralized. Brachytherapy seeds in the regional prostate.  IMPRESSION: No evidence of pelvic fracture.   Electronically Signed   By: Jeb Levering M.D.   On: 05/28/2015 21:46   Ct Head Wo Contrast  05/28/2015   CLINICAL DATA:  Two falls at home.  Altered mental status.  EXAM: CT HEAD WITHOUT CONTRAST  CT CERVICAL SPINE WITHOUT CONTRAST  TECHNIQUE: Multidetector CT imaging of the head and cervical spine was performed following the standard protocol without intravenous contrast. Multiplanar CT image reconstructions of the cervical spine were also generated.  COMPARISON:  Head CT 05/12/2015  FINDINGS: CT HEAD FINDINGS  Generalized atrophy and chronic small vessel ischemia, unchanged from  prior exam.No intracranial hemorrhage, mass effect, or midline shift. No hydrocephalus. The basilar cisterns are patent. No evidence of territorial infarct. No intracranial fluid collection. Calvarium is intact. Included paranasal sinuses and mastoid air cells are well aerated.  CT CERVICAL SPINE FINDINGS  There is no fracture. The dens is intact. There are no jumped or perched facets. Large flowing anterior osteophytes throughout cervical spine. Mild disc space narrowing at C3-C4 and C4-C5. Minimal anterolisthesis of C5 on C6 and C6 on C7 appears degenerative. Minimal flattening at C6 vertebra, chronic. No prevertebral soft tissue edema. Atherosclerosis of the carotid vasculature.  IMPRESSION: 1. No acute intracranial abnormality. Atrophy and chronic small vessel ischemia, stable. 2. No acute fracture or subluxation of cervical spine. Degenerative disc disease. Flowing anterior osteophytes can be seen in the setting of DISH.   Electronically Signed   By: Jeb Levering M.D.   On: 05/28/2015 20:37   Ct Cervical Spine Wo Contrast  05/28/2015   CLINICAL DATA:  Two falls at home.  Altered mental status.  EXAM: CT HEAD WITHOUT CONTRAST  CT CERVICAL SPINE WITHOUT CONTRAST  TECHNIQUE: Multidetector CT imaging of the head and cervical spine was performed following the standard protocol without intravenous contrast. Multiplanar CT image reconstructions of the cervical spine were also generated.  COMPARISON:  Head CT 05/12/2015  FINDINGS: CT HEAD FINDINGS  Generalized atrophy  and chronic small vessel ischemia, unchanged from prior exam.No intracranial hemorrhage, mass effect, or midline shift. No hydrocephalus. The basilar cisterns are patent. No evidence of territorial infarct. No intracranial fluid collection. Calvarium is intact. Included paranasal sinuses and mastoid air cells are well aerated.  CT CERVICAL SPINE FINDINGS  There is no fracture. The dens is intact. There are no jumped or perched facets. Large flowing  anterior osteophytes throughout cervical spine. Mild disc space narrowing at C3-C4 and C4-C5. Minimal anterolisthesis of C5 on C6 and C6 on C7 appears degenerative. Minimal flattening at C6 vertebra, chronic. No prevertebral soft tissue edema. Atherosclerosis of the carotid vasculature.  IMPRESSION: 1. No acute intracranial abnormality. Atrophy and chronic small vessel ischemia, stable. 2. No acute fracture or subluxation of cervical spine. Degenerative disc disease. Flowing anterior osteophytes can be seen in the setting of DISH.   Electronically Signed   By: Jeb Levering M.D.   On: 05/28/2015 20:37   Dg Chest Port 1 View  05/28/2015   CLINICAL DATA:  Hypotensive, cough.  Congestion.  EXAM: PORTABLE CHEST - 1 VIEW  COMPARISON:  06/26/2011  FINDINGS: Patient is post median sternotomy. The cardiomediastinal contours are normal. Soft tissue fullness in the right paratracheal region is unchanged from prior. Pulmonary vasculature is normal. No consolidation, pleural effusion, or pneumothorax. Skin folds project over the left hemithorax. No acute osseous abnormalities are seen.  IMPRESSION: No acute pulmonary process.   Electronically Signed   By: Jeb Levering M.D.   On: 05/28/2015 20:03    EKG:   Orders placed or performed during the hospital encounter of 05/28/15  . ED EKG 12-Lead  . ED EKG 12-Lead    IMPRESSION AND PLAN:   81 year old African American gentleman history of dementia, essential hypertension presenting after falls at home.  1. Acute kidney injury, prerenal versus ATN: IV fluid hydration, follow urine output/renal function, check renal ultrasound, hold diuretic and and ACE inhibitor, hold further nephrotoxic agents 2. Essential hypertension: Given his relative hypotension we'll hold all agents continue IV fluids 3. Hyperlipidemia unspecified: Pravachol 4. Dementia: Aricept 5. Venous thromboembolism prophylactic: Heparin subcutaneous    All the records are reviewed and case  discussed with ED provider. Management plans discussed with the patient, family and they are in agreement.  CODE STATUS: Full  TOTAL TIME TAKING CARE OF THIS PATIENT: 35 minutes.    Sion Thane,  Karenann Cai.D on 05/28/2015 at 11:29 PM  Between 7am to 6pm - Pager - [phone]  After 6pm: House Pager: - [phone]  [facility]  Office  [phone]  CC: Primary care physician; [facility], Chrissie Noa, MD

## 2015-05-28 NOTE — ED Notes (Signed)
Pt's brief pulled down and urinal placed between pt's legs in attempt to obtain a urine sample, wife at bedside, oriented to call bell if they needed anything, cont to monitor

## 2015-05-28 NOTE — ED Notes (Signed)
Pt from home via EMS. Fall x2 at home. EMS reports initial BP 53/29, given 500cc bolus. BP on arrival 93/61.

## 2015-05-28 NOTE — ED Provider Notes (Signed)
[facility] Emergency Department Provider Note  ____________________________________________  Time seen: Upon arrival to the emergency department I have reviewed the triage vital signs and the nursing notes.   HISTORY  Chief Complaint Fall and Hypotension    HPI Jimmy Horn is a 81 y.o. male with a history of CAD, hypertension and dementia who presents to the emergency department today for 2 falls at home. The falls were unwitnessed. His wife and daughter as of 8:45 PM or the bedside and say that a thud was heard from an adjacent room by the wife and then she went in and the patient was sitting up on the ground on his buttocks. The wife and the daughter stated the patient has had frequent falls. They said that he had 2 falls today. No seizure activity noted. No known loss of consciousness. They stated that he is acting at his neurologic baseline. He said that he rarely talks but does follow commands.   History reviewed. No pertinent past medical history.  Patient Active Problem List   Diagnosis Date Noted  . CAD (coronary artery disease) 05/02/2011  . HTN (hypertension) 05/02/2011  . High cholesterol 05/02/2011  . Prostate cancer 05/02/2011    Past Surgical History  Procedure Laterality Date  . Coronary artery bypass graft  04/24/2011    Dr. Roxan Hockey  . Appendectomy      Current Outpatient Rx  Name  Route  Sig  Dispense  Refill  . HYDROcodone-acetaminophen (VICODIN) 5-500 MG per tablet   Oral   Take 1 tablet by mouth every 6 (six) hours as needed.           Marland Kitchen lisinopril (PRINIVIL,ZESTRIL) 10 MG tablet   Oral   Take 10 mg by mouth daily.           . metoprolol (LOPRESSOR) 50 MG tablet               . metoprolol (TOPROL-XL) 50 MG 24 hr tablet   Oral   Take 50 mg by mouth 2 (two) times daily.           . simvastatin (ZOCOR) 20 MG tablet                 Allergies Review of patient's allergies indicates no known  allergies.  Family History  Problem Relation Age of Onset  . Heart disease Mother     died of MI at age 65  . Cancer Brother   . Cancer Brother     Social History History  Substance Use Topics  . Smoking status: Never Smoker   . Smokeless tobacco: Not on file  . Alcohol Use: No    Review of Systems Constitutional: No fever/chills Eyes: No visual changes. ENT: No sore throat. Cardiovascular: Denies chest pain. Respiratory: Denies shortness of breath. Gastrointestinal: No abdominal pain.  No nausea, no vomiting.  No diarrhea.  No constipation. Genitourinary: Negative for dysuria. Musculoskeletal: Negative for back pain. Skin: Negative for rash. Neurological: Negative for headaches, focal weakness or numbness.  10-point ROS otherwise negative. However, the review of systems was confounded by the patient's poor ability to communicate.  ____________________________________________   PHYSICAL EXAM:  VITAL SIGNS: ED Triage Vitals  Enc Vitals Group     BP 05/28/15 1914 93/61 mmHg     Pulse Rate 05/28/15 1914 79     Resp 05/28/15 1914 16     Temp 05/28/15 1914 98.1 F (36.7 C)     Temp Source 05/28/15  1914 Oral     SpO2 05/28/15 1914 100 %     Weight 05/28/15 1914 150 lb (68.04 kg)     Height 05/28/15 1914 5\' 9"  (1.753 m)     Head Cir --      Peak Flow --      Pain Score --      Pain Loc --      Pain Edu? --      Excl. in [place]? --     Constitutional: Alert and oriented. Well appearing and in no acute distress. Eyes: Conjunctivae are normal. PERRL. EOMI. Head: Atraumatic. Superficial laceration that is 1 cm just superior to the left eyebrow. No active bleeding, induration or pus. Nose: No congestion/rhinnorhea. Mouth/Throat: Mucous membranes are moist.  Oropharynx non-erythematous. Neck: No stridor.   Cardiovascular: Normal rate, regular rhythm. Grossly normal heart sounds.  Good peripheral circulation. Respiratory: Normal respiratory effort.  No retractions. Lungs  CTAB. Gastrointestinal: Soft and nontender. No distention. No abdominal bruits. No CVA tenderness. Musculoskeletal: Mild edema to the bilateral lower extremities.  No joint effusions. Neurologic:  Normal speech and language. No gross focal neurologic deficits are appreciated. No gait instability. Skin:  Skin is warm, dry and intact. No rash noted. Psychiatric: Mood and affect are normal. Speech and behavior are normal.  ____________________________________________   LABS (all labs ordered are listed, but only abnormal results are displayed)  Labs Reviewed  COMPREHENSIVE METABOLIC PANEL - Abnormal; Notable for the following:    Glucose, Bld 112 (*)    BUN 43 (*)    Creatinine, Ser 3.08 (*)    Total Protein 6.4 (*)    ALT 10 (*)    Total Bilirubin 1.3 (*)    GFR calc non Af Amer 17 (*)    GFR calc Af Amer 20 (*)    All other components within normal limits  CBC WITH DIFFERENTIAL/PLATELET - Abnormal; Notable for the following:    WBC 11.9 (*)    RBC 4.32 (*)    Hemoglobin 12.2 (*)    HCT 36.9 (*)    RDW 14.7 (*)    Neutro Abs 9.6 (*)    All other components within normal limits  CULTURE, BLOOD (ROUTINE X 2)  CULTURE, BLOOD (ROUTINE X 2)  URINE CULTURE  LACTIC ACID, PLASMA  LACTIC ACID, PLASMA  URINALYSIS COMPLETEWITH MICROSCOPIC ([facility] ONLY)   ____________________________________________  EKG  ED ECG REPORT I, Doran Stabler, the attending physician, personally viewed and interpreted this ECG.   Date: 05/28/2015  EKG Time: 1932  Rate: 78  Rhythm: normal sinus rhythm  Axis: Left axis deviation  Intervals:none  ST&T Change: T wave inversions in 1 and aVL. No ST elevations or depressions. No change from previous EKG of 06/26/2011.  ____________________________________________  RADIOLOGY  No acute intracranial abnormality. No acute fracture of the cervical spine. Chest x-ray without acute pulmonary process. I personally reviewed these images. Pelvic x-ray  without any acute pathology. ____________________________________________   PROCEDURES    ____________________________________________   INITIAL IMPRESSION / ASSESSMENT AND PLAN / ED COURSE  Pertinent labs & imaging results that were available during my care of the patient were reviewed by me and considered in my medical decision making (see chart for details).  ----------------------------------------- 10:00 PM on 05/28/2015 -----------------------------------------  Patient with improved blood pressure with fluid hydration. Persistently in the 90s. Not tachycardic. Found to have acute renal failure. Still pending urinalysis. We'll need catheterized specimen. Signed out to Dr. Lavetta Nielsen for admission. ____________________________________________  FINAL CLINICAL IMPRESSION(S) / ED DIAGNOSES  Acute renal failure. Initial visit.    Orbie Pyo, MD 05/28/15 [phone]

## 2015-05-28 NOTE — ED Notes (Signed)
All labs, inc grey top on ice and bcx1 drawn rt wrist and sent to lab. BC x2 off lt wrist drawn and sent to lab

## 2015-05-29 ENCOUNTER — Inpatient Hospital Stay: Payer: Commercial Managed Care - HMO

## 2015-05-29 LAB — URINALYSIS COMPLETE WITH MICROSCOPIC (ARMC ONLY)
Bilirubin Urine: NEGATIVE
GLUCOSE, UA: NEGATIVE mg/dL
Ketones, ur: NEGATIVE mg/dL
Leukocytes, UA: NEGATIVE
Nitrite: NEGATIVE
Protein, ur: NEGATIVE mg/dL
SPECIFIC GRAVITY, URINE: 1.015 (ref 1.005–1.030)
pH: 5 (ref 5.0–8.0)

## 2015-05-29 LAB — BASIC METABOLIC PANEL
Anion gap: 9 (ref 5–15)
BUN: 39 mg/dL — ABNORMAL HIGH (ref 6–20)
CALCIUM: 8.9 mg/dL (ref 8.9–10.3)
CO2: 26 mmol/L (ref 22–32)
CREATININE: 1.96 mg/dL — AB (ref 0.61–1.24)
Chloride: 110 mmol/L (ref 101–111)
GFR, EST AFRICAN AMERICAN: 35 mL/min — AB (ref >60)
GFR, EST NON AFRICAN AMERICAN: 30 mL/min — AB (ref >60)
GLUCOSE: 98 mg/dL (ref 65–99)
Potassium: 3.8 mmol/L (ref 3.5–5.1)
Sodium: 145 mmol/L (ref 135–145)

## 2015-05-29 LAB — LACTIC ACID, PLASMA: Lactic Acid, Venous: 1.5 mmol/L (ref 0.5–2.0)

## 2015-05-29 MED ORDER — PRAVASTATIN SODIUM 20 MG PO TABS
40.0000 mg | ORAL_TABLET | Freq: Every day | ORAL | Status: DC
  Administered 2015-05-30: 40 mg via ORAL
  Filled 2015-05-29: qty 2

## 2015-05-29 MED ORDER — DONEPEZIL HCL 5 MG PO TABS
10.0000 mg | ORAL_TABLET | Freq: Every day | ORAL | Status: DC
  Administered 2015-05-29: 10 mg via ORAL
  Filled 2015-05-29 (×2): qty 2

## 2015-05-29 MED ORDER — ASPIRIN EC 81 MG PO TBEC
81.0000 mg | DELAYED_RELEASE_TABLET | Freq: Every day | ORAL | Status: DC
  Administered 2015-05-30: 81 mg via ORAL
  Filled 2015-05-29: qty 1

## 2015-05-29 MED ORDER — HALOPERIDOL LACTATE 5 MG/ML IJ SOLN
5.0000 mg | Freq: Four times a day (QID) | INTRAMUSCULAR | Status: DC | PRN
  Administered 2015-05-31: 5 mg via INTRAVENOUS
  Filled 2015-05-29: qty 1

## 2015-05-29 MED ORDER — QUETIAPINE FUMARATE 25 MG PO TABS
25.0000 mg | ORAL_TABLET | Freq: Every day | ORAL | Status: DC
  Administered 2015-05-29: 25 mg via ORAL
  Filled 2015-05-29 (×2): qty 1

## 2015-05-29 MED ORDER — COLCHICINE 0.6 MG PO TABS
0.6000 mg | ORAL_TABLET | Freq: Every day | ORAL | Status: DC
  Administered 2015-05-30: 10:00:00 0.6 mg via ORAL
  Filled 2015-05-29: qty 1

## 2015-05-29 MED ORDER — CETYLPYRIDINIUM CHLORIDE 0.05 % MT LIQD: 7.0000 mL | Freq: Two times a day (BID) | OROMUCOSAL | Status: DC

## 2015-05-29 NOTE — Care Management (Signed)
Admitted to [facility] with the diagnosis of acute kidney injury. Lives with wife, Marnette Burgess [phone]). Sees Dr. Rosine Abe. Last seen 2 weeks ago. No home Health. No skilled facility. No home oxygen. Wife helps with bath and shaving. Appetite is "O.K." per wife. Uses a rolling walker to aid in ambulation.  Physical therapy and speech evaluation pending further plans. Shelbie Ammons RN MSN Care management [phone]

## 2015-05-29 NOTE — Progress Notes (Signed)
PT Attempt Note  Patient Details Name: Jimmy Horn MRN: [id number] DOB: 04-10-1931   Cancelled Treatment:    Reason Eval/Treat Not Completed: Patient declined, no reason specified. Chart reviewed and RN consulted. Pt was combative and aggressive with staff overnight but has been doing better today. History obtained from wife. Attempted to work with patient but he adamantly refuses. Continual encouragement provided but he repeatedly refuses and eventually states, "you are picking a fight man." Pt does not participate with therapy. Will attempt evaluation on later date as patient is willing to participate. If patient does not demonstrate good participation with therapy he would not be a candidate for SNF. Family wishes are to take patient home at discharge.  [name] PT, DPT   Elven Laboy 05/29/2015, 2:42 PM

## 2015-05-29 NOTE — Progress Notes (Addendum)
Initial Nutrition Assessment  INTERVENTION:   Meals and Snacks: Cater to patient preferences; RD notes SLP evaluation pending Medical Food Supplement Therapy: will recommend Mighty Shakes on meal trays TID for added nutrition (each shake provides 300kcals and 9g protein)   NUTRITION DIAGNOSIS:   Inadequate oral intake related to poor appetite as evidenced by meal completion < 25%.  GOAL:   Patient will meet greater than or equal to 90% of their needs  MONITOR:    (Energy Intake, Anthropometrics, Digestive System, Electrolyte and renal Profile)  REASON FOR ASSESSMENT:   Malnutrition Screening Tool    ASSESSMENT:   Pt admitted after fall with acute kidney injury.  PMHx:  Dementia, essential hypertension, hyperlipidemia unspecified, coronary artery disease post CABG  Diet Order:  Diet Heart Room service appropriate?: Yes; Fluid consistency:: Thin    Current Nutrition: Pt ate 15% of breakfast tray this am on visit.  Food/Nutrition-Related History: Pt reports eating 3 meals per day PTA, pt wife reports pt eats breakfast in the am, sandwich for lunch and dinner at night. Pt reports drinking Ensure PTA, likes vanilla.   Medications: NS at 69mL/hr  Electrolyte/Renal Profile and Glucose Profile:   Recent Labs Lab 05/28/15 1933 05/29/15 0856  NA 143 145  K 3.9 3.8  CL 107 110  CO2 26 26  BUN 43* 39*  CREATININE 3.08* 1.96*  CALCIUM 9.1 8.9  GLUCOSE 112* 98   Protein Profile:  Recent Labs Lab 05/28/15 1933  ALBUMIN 3.7    Skin:  Reviewed, no issues  Gastrointestinal Profile: Last BM:  05/29/2015   Nutrition-Focused Physical Exam Findings: Nutrition-Focused physical exam completed. Findings are WDL for fat depletion, muscle depletion, and edema.    Weight Change: Pt family reports weight loss PTA. Pt family reports weight of 150lbs 3 weeks ago at outpatient appointment, RD notes current weight of 149lbs.   Height:   Ht Readings from Last 1 Encounters:   05/29/15 5\' 9"  (1.753 m)    Weight:   Wt Readings from Last 1 Encounters:  05/29/15 149 lb 9.6 oz (67.858 kg)    BMI:  Body mass index is 22.08 kg/(m^2).  Estimated Nutritional Needs:   Kcal:  1800-2128kcals, BEE: 1364kcals, TEE: (IF 1.1-1.3)(AF 1.2)   Protein:  68-81g protein (1.0-1.2g/kg)  Fluid:  1698-2056mL of fluid (25-24mL/kg)  EDUCATION NEEDS:   No education needs identified at this time   [place], RD, LDN Pager [phone]

## 2015-05-29 NOTE — Progress Notes (Signed)
Patient cooperative up to bedside commode with 2 person assist - requiring frequent redirection. Madlyn Frankel, RN
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
## 2015-05-29 NOTE — Progress Notes (Signed)
[facility] NAME: Jimmy Horn    MR#:  [id number]  DATE OF BIRTH:  1931-08-30  SUBJECTIVE:  CHIEF COMPLAINT:   Chief Complaint  Patient presents with  . Fall  . Hypotension   the patient is demented.  REVIEW OF SYSTEMS:  Demented, unable to provide review of system.   DRUG ALLERGIES:  No Known Allergies  VITALS:  Blood pressure 118/60, pulse 71, temperature 97.6 F (36.4 C), temperature source Oral, resp. rate 22, height 5\' 9"  (1.753 m), weight 67.858 kg (149 lb 9.6 oz), SpO2 100 %.  PHYSICAL EXAMINATION:  GENERAL:  81 y.o.-year-old patient lying in the bed with no acute distress.  EYES: Pupils equal, round, reactive to light and accommodation. No scleral icterus. Extraocular muscles intact.  HEENT: Head atraumatic, normocephalic. Oropharynx and nasopharynx clear.  NECK:  Supple, no jugular venous distention. No thyroid enlargement, no tenderness.  LUNGS: Normal breath sounds bilaterally, no wheezing, rales,rhonchi or crepitation. No use of accessory muscles of respiration.  CARDIOVASCULAR: S1, S2 normal. No murmurs, rubs, or gallops.  ABDOMEN: Soft, nontender, nondistended. Bowel sounds present. No organomegaly or mass.  EXTREMITIES: No pedal edema, cyanosis, or clubbing.  NEUROLOGIC: Cranial nerves II through XII are intact. Muscle strength 4/5 in all extremities. Sensation intact. Gait not checked.  PSYCHIATRIC:  Demented. SKIN: No obvious rash, lesion, or ulcer.    LABORATORY PANEL:   CBC  Recent Labs Lab 05/28/15 1933  WBC 11.9*  HGB 12.2*  HCT 36.9*  PLT 189   ------------------------------------------------------------------------------------------------------------------  Chemistries   Recent Labs Lab 05/28/15 1933 05/29/15 0856  NA 143 145  K 3.9 3.8  CL 107 110  CO2 26 26  GLUCOSE 112* 98  BUN 43* 39*  CREATININE 3.08* 1.96*  CALCIUM 9.1 8.9  AST 20  --   ALT 10*  --   ALKPHOS  52  --   BILITOT 1.3*  --    ------------------------------------------------------------------------------------------------------------------  Cardiac Enzymes No results for input(s): TROPONINI in the last 168 hours. ------------------------------------------------------------------------------------------------------------------  RADIOLOGY:  Dg Pelvis 1-2 Views  05/28/2015   CLINICAL DATA:  Unwitnessed fall.  Altered mental status.  EXAM: PELVIS - 1-2 VIEW  COMPARISON:  None.  FINDINGS: The cortical margins of the bony pelvis are intact. No fracture. Pubic symphysis and sacroiliac joints are congruent. Both femoral heads are well-seated in the respective acetabula. Multifocal of the left optic change. The bones are under mineralized. Brachytherapy seeds in the regional prostate.  IMPRESSION: No evidence of pelvic fracture.   Electronically Signed   By: Jeb Levering M.D.   On: 05/28/2015 21:46   Ct Head Wo Contrast  05/28/2015   CLINICAL DATA:  Two falls at home.  Altered mental status.  EXAM: CT HEAD WITHOUT CONTRAST  CT CERVICAL SPINE WITHOUT CONTRAST  TECHNIQUE: Multidetector CT imaging of the head and cervical spine was performed following the standard protocol without intravenous contrast. Multiplanar CT image reconstructions of the cervical spine were also generated.  COMPARISON:  Head CT 05/12/2015  FINDINGS: CT HEAD FINDINGS  Generalized atrophy and chronic small vessel ischemia, unchanged from prior exam.No intracranial hemorrhage, mass effect, or midline shift. No hydrocephalus. The basilar cisterns are patent. No evidence of territorial infarct. No intracranial fluid collection. Calvarium is intact. Included paranasal sinuses and mastoid air cells are well aerated.  CT CERVICAL SPINE FINDINGS  There is no fracture. The dens is intact. There are no jumped or perched facets. Large flowing anterior  osteophytes throughout cervical spine. Mild disc space narrowing at C3-C4 and C4-C5.  Minimal anterolisthesis of C5 on C6 and C6 on C7 appears degenerative. Minimal flattening at C6 vertebra, chronic. No prevertebral soft tissue edema. Atherosclerosis of the carotid vasculature.  IMPRESSION: 1. No acute intracranial abnormality. Atrophy and chronic small vessel ischemia, stable. 2. No acute fracture or subluxation of cervical spine. Degenerative disc disease. Flowing anterior osteophytes can be seen in the setting of DISH.   Electronically Signed   By: Jeb Levering M.D.   On: 05/28/2015 20:37   Ct Cervical Spine Wo Contrast  05/28/2015   CLINICAL DATA:  Two falls at home.  Altered mental status.  EXAM: CT HEAD WITHOUT CONTRAST  CT CERVICAL SPINE WITHOUT CONTRAST  TECHNIQUE: Multidetector CT imaging of the head and cervical spine was performed following the standard protocol without intravenous contrast. Multiplanar CT image reconstructions of the cervical spine were also generated.  COMPARISON:  Head CT 05/12/2015  FINDINGS: CT HEAD FINDINGS  Generalized atrophy and chronic small vessel ischemia, unchanged from prior exam.No intracranial hemorrhage, mass effect, or midline shift. No hydrocephalus. The basilar cisterns are patent. No evidence of territorial infarct. No intracranial fluid collection. Calvarium is intact. Included paranasal sinuses and mastoid air cells are well aerated.  CT CERVICAL SPINE FINDINGS  There is no fracture. The dens is intact. There are no jumped or perched facets. Large flowing anterior osteophytes throughout cervical spine. Mild disc space narrowing at C3-C4 and C4-C5. Minimal anterolisthesis of C5 on C6 and C6 on C7 appears degenerative. Minimal flattening at C6 vertebra, chronic. No prevertebral soft tissue edema. Atherosclerosis of the carotid vasculature.  IMPRESSION: 1. No acute intracranial abnormality. Atrophy and chronic small vessel ischemia, stable. 2. No acute fracture or subluxation of cervical spine. Degenerative disc disease. Flowing anterior  osteophytes can be seen in the setting of DISH.   Electronically Signed   By: Jeb Levering M.D.   On: 05/28/2015 20:37   US Renal  05/29/2015   CLINICAL DATA:  Acute renal injury. Two falls at home. History of renal cysts. Diminished renal function.  EXAM: RENAL / URINARY TRACT ULTRASOUND COMPLETE  COMPARISON:  Report from CT 08/12/2008, images not available  FINDINGS: Right Kidney:  Length: 11 cm. Multiple cysts in the right kidney. Largest is multilobular in shape measuring 4.6 x 4.8 x 5.1 cm. Additional smaller rounded simple cysts are seen throughout the right kidney. No hydronephrosis visualized. No perinephric fluid collection. No evidence of solid lesion.  Left Kidney:  Length: 10.8 cm. Echogenicity within diffusely increased. Cyst in the interpolar region measures 1.8 x 2.5 x 2.3 cm No mass or hydronephrosis visualized. No perirenal fluid collection.  Bladder:  Appears normal for degree of bladder distention.  IMPRESSION: 1. Bilateral renal cysts. Largest in the right kidney is multilobular in shape and measures 5.1 cm. No evidence for solid lesion. 2. No obstructive uropathy. 3. Mild increased left renal echogenicity, suggestive of chronic medical renal disease.   Electronically Signed   By: Jeb Levering M.D.   On: 05/29/2015 01:32   Dg Chest Port 1 View  05/28/2015   CLINICAL DATA:  Hypotensive, cough.  Congestion.  EXAM: PORTABLE CHEST - 1 VIEW  COMPARISON:  06/26/2011  FINDINGS: Patient is post median sternotomy. The cardiomediastinal contours are normal. Soft tissue fullness in the right paratracheal region is unchanged from prior. Pulmonary vasculature is normal. No consolidation, pleural effusion, or pneumothorax. Skin folds project over the left hemithorax. No acute osseous abnormalities  are seen.  IMPRESSION: No acute pulmonary process.   Electronically Signed   By: Jeb Levering M.D.   On: 05/28/2015 20:03    EKG:   Orders placed or performed during the hospital encounter of  05/28/15  . ED EKG 12-Lead  . ED EKG 12-Lead  . EKG 12-Lead  . EKG 12-Lead    ASSESSMENT AND PLAN:   1. Acute kidney injury, due to ATN; bilateral kidney cysts. Continue IV fluid hydration, follow-up BMP,  hold diuretic and and ACE inhibitor. Renal ultrasound: Bilateral kidney cysts, no obstruction.   2. Essential hypertension. hold lisinopril and HCTZ.  3. Hyperlipidemia unspecified: Pravachol 4. Dementia: Aricept   PT evaluation and speech study due to choking.   All the records are reviewed and case discussed with Care Management/Social Workerr. Management plans discussed with the patient, his wife and daughters and they are in agreement.  CODE STATUS: Full code  TOTAL TIME TAKING CARE OF THIS PATIENT: 43 minutes.   POSSIBLE D/C IN 2-3 DAYS, DEPENDING ON CLINICAL CONDITION.   Demetrios Loll M.D on 05/29/2015 at 11:45 AM  Between 7am to 6pm - Pager - [phone]  After 6pm go to [email or web address] - password [email or web address] [facility]  [facility]  Office  [phone]  CC: Primary care physician; [facility], Chrissie Noa, MD

## 2015-05-29 NOTE — Plan of Care (Signed)
Problem: Discharge Progression Outcomes Goal: Other Discharge Outcomes/Goals Plan of care progress to goal:  No complaints of pain. NS infusing at 75 ml/hr. Labs are improving. Family remains at bedside. Patient is cooperative with staff when approached appropriately.

## 2015-05-30 ENCOUNTER — Inpatient Hospital Stay: Payer: Commercial Managed Care - HMO

## 2015-05-30 LAB — BASIC METABOLIC PANEL
Anion gap: 6 (ref 5–15)
BUN: 27 mg/dL — ABNORMAL HIGH (ref 6–20)
CO2: 27 mmol/L (ref 22–32)
Calcium: 8.9 mg/dL (ref 8.9–10.3)
Chloride: 111 mmol/L (ref 101–111)
Creatinine, Ser: 1.16 mg/dL (ref 0.61–1.24)
GFR calc Af Amer: >60 mL/min (ref >60)
GFR calc non Af Amer: 57 mL/min — ABNORMAL LOW (ref >60)
Glucose, Bld: 96 mg/dL (ref 65–99)
POTASSIUM: 3.8 mmol/L (ref 3.5–5.1)
SODIUM: 144 mmol/L (ref 135–145)

## 2015-05-30 LAB — URINE CULTURE: Culture: NO GROWTH

## 2015-05-30 NOTE — Plan of Care (Signed)
Problem: Discharge Progression Outcomes Goal: Tolerating diet Outcome: Not Progressing -Family is going to decide if they want to move forward with a PEG tube or more palliative feedings. -Wife at bedside and pt and family agree with plan of care -  Speech worked with pt and indicated that he was aspirating and not tolerating eating/drinking without aspirating

## 2015-05-30 NOTE — Care Management Important Message (Signed)
Important Message  Patient Details  Name: Jimmy Horn MRN: [id number] Date of Birth: 1931-06-10   Medicare Important Message Given:  Yes-second notification given    [name] 05/30/2015, 9:45 AM

## 2015-05-30 NOTE — Plan of Care (Addendum)
Problem: Discharge Progression Outcomes Goal: Other Discharge Outcomes/Goals Outcome: Progressing Patient is alert to self, no c/o pain. Incontinent of urine, combative at times. Willing to take pills crushed finely in vanilla ice cream, family at bedside. NS continues at 75 ml/hr.

## 2015-05-30 NOTE — Consult Note (Signed)
Palliative Medicine Inpatient Consult Note   Name: Jimmy Horn Date: 05/30/2015 MRN: [id number]  DOB: 17-May-1931  Referring Physician: Demetrios Loll, MD  Palliative Care consult requested for this 81 y.o. male for goals of medical therapy in patient who lives at home who has dementia and was found in the ER to have signs of dehydration due to decreased oral intake which was felt to be due to advancing dementia with loss of appetite.  He was brought in because he had had two falls and he was found to be hypotensive and was given fluids for the acute kidney injury.  Diuretics and Ace Inhibitor were held.  He was noted to have poor management of oral secretions and has had an ST evaluation.  The recommendation that he have a mech soft (dysphagia 3) diet with liquids at nectar consistency while MBSS results were pending.   TODAY'S INTERVENTIONS AND CONVERSATIONS, RECOMMENDATIONS, AND PLANS:  1) Patient's wife defers decision making to her daughters.  She admits to not thinking clearly about everything all the time. It seems that she might possibly have a very early dementia or else she may never have taken charge of a number of issues with her husband.  We need to be sensitive to the fact that his wife is NOT going to be able to manage a calorie count, Peg tube, or other complex instructions.  It is too late for pt to get a HCPOA, but it ought to be determined who in the family is the primary decision maker.  i heard from the mother that it was one daughter, then later, she mentioned the other daughter as the primary decision maker.  There is also a son, but I do not think he is one who makes decisions about his father.   2) One daughter, Otila Kluver, stated clearly that her mother would not be able to manage a feeding tube in the home. So if he gets this, he would need to be in a facility (since both daughters work).  But, Otila Kluver is going to discuss the pros and cons of a feeding tube and they may end up deciding to  have him return to home without a feeding tube --while recognizing the risk that eating/ drinking poses to him in terms of getting food and liquids into his lungs.  She is told that he does not manage his saliva well and some of this is going into his lungs constantly.    3) I brought up code status with Otila Kluver.  Pt is a full code.  I explained the issue of probably futility related to resuscitation in elderly patients but assured her we respect their wishes on this matter.  I will discuss it further tomorrow.   4) I talked at length with Orinda Kenner, Speech Therapist.  We decided together to make him NPO and no meds orally till tomorrow--to give time to family to decide what to do.  I will have to also talk with the oldest daughter, Vaughan Basta, who is at work at [employer] right now (will try to call her today or else see her early in the am, here).   So---- for now, no decisions have been made by family and he is still full code. But we have learned more about the dynamics in the home setting and the fact that his wife cannot handle complex tasks related to care-giving.       REVIEW OF SYSTEMS:  Patient is not able to provide ROS due to  advanced dementia  SPIRITUAL SUPPORT SYSTEM: Yes --Patient has supportive wife and family.  .  SOCIAL HISTORY:  reports that he has never smoked. He does not have any smokeless tobacco history on file. He reports that he does not drink alcohol or use illicit drugs.  LEGAL DOCUMENTS: None in hard chart. Pt's youngest daughter is reportedly the 'decision maker'    CODE STATUS:       FULL CODE  PAST MEDICAL HISTORY:  CAD Alzheimer's Dementa Dyslipidemia HTN Gout   PAST SURGICAL HISTORY:  Past Surgical History  Procedure Laterality Date  . Coronary artery bypass graft  04/24/2011    Dr. Roxan Hockey  . Appendectomy      ALLERGIES:  has No Known Allergies.  MEDICATIONS:  Current Facility-Administered Medications  Medication Dose Route Frequency  Provider Last Rate Last Dose  . 0.9 %  sodium chloride infusion   Intravenous Continuous Demetrios Loll, MD 75 mL/hr at 05/30/15 0514    . acetaminophen (TYLENOL) tablet 650 mg  650 mg Oral Q6H PRN Lytle Butte, MD       Or  . acetaminophen (TYLENOL) suppository 650 mg  650 mg Rectal Q6H PRN Lytle Butte, MD      . antiseptic oral rinse (CPC / CETYLPYRIDINIUM CHLORIDE 0.05%) solution 7 mL  7 mL Mouth Rinse BID Demetrios Loll, MD   7 mL at 05/29/15 2200  . aspirin EC tablet 81 mg  81 mg Oral Daily Lytle Butte, MD   81 mg at 05/30/15 5361  . colchicine tablet 0.6 mg  0.6 mg Oral Daily Lytle Butte, MD   0.6 mg at 05/30/15 4431  . donepezil (ARICEPT) tablet 10 mg  10 mg Oral QHS Lytle Butte, MD   10 mg at 05/29/15 2017  . haloperidol lactate (HALDOL) injection 5 mg  5 mg Intravenous Q6H PRN Harrie Foreman, MD      . heparin injection 5,000 Units  5,000 Units Subcutaneous 3 times per day Lytle Butte, MD   5,000 Units at 05/30/15 [phone]  . morphine 2 MG/ML injection 2 mg  2 mg Intravenous Q4H PRN Lytle Butte, MD      . ondansetron [facility]) tablet 4 mg  4 mg Oral Q6H PRN Lytle Butte, MD       Or  . ondansetron [facility]) injection 4 mg  4 mg Intravenous Q6H PRN Lytle Butte, MD      . pravastatin (PRAVACHOL) tablet 40 mg  40 mg Oral Daily Lytle Butte, MD   40 mg at 05/30/15 8676  . QUEtiapine (SEROQUEL) tablet 25 mg  25 mg Oral QHS Lytle Butte, MD   25 mg at 05/29/15 2017    Vital Signs: BP 152/73 mmHg  Pulse 78  Temp(Src) 97.5 F (36.4 C) (Oral)  Resp 18  Ht 5\' 9"  (1.753 m)  Wt 67.858 kg (149 lb 9.6 oz)  BMI 22.08 kg/m2  SpO2 99% Filed Weights   05/28/15 1914 05/29/15 0013  Weight: 68.04 kg (150 lb) 67.858 kg (149 lb 9.6 oz)    Estimated body mass index is 22.08 kg/(m^2) as calculated from the following:   Height as of this encounter: 5\' 9"  (1.753 m).   Weight as of this encounter: 67.858 kg (149 lb 9.6 oz).  PERFORMANCE STATUS (ECOG) : 2 - Symptomatic, <50% confined to bed   --BUT FALLS WHEN UP ON HIS OWN   PHYSICAL EXAM: NAD but constantly clearing his throat and  yet his throat clearing does not seem effective EOMI OP clear  Neck w/o JVD or TM Hrt rrr no mgr Lungs with some occasional ronchi but otw clear Abd soft and NT Skin no mottling or open areas Neuro --he asks me nonsensical questions    LABS: CBC:    Component Value Date/Time   WBC 11.9* 05/28/2015 1933   WBC 10.5 11/21/2014 1003   HGB 12.2* 05/28/2015 1933   HGB 13.0 11/21/2014 1003   HCT 36.9* 05/28/2015 1933   HCT 39.3* 11/21/2014 1003   PLT 189 05/28/2015 1933   PLT 252 11/21/2014 1003   MCV 85.4 05/28/2015 1933   MCV 86 11/21/2014 1003   NEUTROABS 9.6* 05/28/2015 1933   NEUTROABS 8.1* 11/21/2014 1003   LYMPHSABS 1.3 05/28/2015 1933   LYMPHSABS 1.7 11/21/2014 1003   MONOABS 0.9 05/28/2015 1933   MONOABS 0.6 11/21/2014 1003   EOSABS 0.0 05/28/2015 1933   EOSABS 0.1 11/21/2014 1003   BASOSABS 0.0 05/28/2015 1933   BASOSABS 0.0 11/21/2014 1003   Comprehensive Metabolic Panel:    Component Value Date/Time   NA 144 05/30/2015 1018   NA 143 11/21/2014 1003   K 3.8 05/30/2015 1018   K 3.9 11/21/2014 1003   CL 111 05/30/2015 1018   CL 108* 11/21/2014 1003   CO2 27 05/30/2015 1018   CO2 28 11/21/2014 1003   BUN 27* 05/30/2015 1018   BUN 24* 11/21/2014 1003   CREATININE 1.16 05/30/2015 1018   CREATININE 1.11 11/21/2014 1003   GLUCOSE 96 05/30/2015 1018   GLUCOSE 117* 11/21/2014 1003   CALCIUM 8.9 05/30/2015 1018   CALCIUM 8.6 11/21/2014 1003   AST 20 05/28/2015 1933   AST 24 11/21/2014 1003   ALT 10* 05/28/2015 1933   ALT 13* 11/21/2014 1003   ALKPHOS 52 05/28/2015 1933   ALKPHOS 68 11/21/2014 1003   BILITOT 1.3* 05/28/2015 1933   BILITOT 0.8 11/21/2014 1003   PROT 6.4* 05/28/2015 1933   PROT 7.1 11/21/2014 1003   ALBUMIN 3.7 05/28/2015 1933   ALBUMIN 3.3* 11/21/2014 1003    IMPRESSION: 1) Acute kidney Injury due to dehydration  --due to poor oral intake  related to appetite loss from dementia  --possibly also related to worsening dysphagia due to his advanced dementia --possibly also related to patient's wife having some issues understanding or remembering how to manage and monitor his oral intake, etc 2) Alzheimer's type Dementia  --on Aricept --Now falling and having trouble swallowing  ----Hence is at advanced stage 2) Renal cysts --seen on ultrasound (not newly discovered apparently) 3) History of CAD 4) Dyslipidemia 5) HTN (on diuretics and ACE-I normally (now held due to his presentation) 6) Mild Protein Calorie Malnutrition     SEE ABOVE FOR PLAN:   REFERRALS TO BE ORDERED:   I doubt that this family will want to opt for Hospice in the home --as they are collectively all somewhat new to the concept of the need to recognize patient's deterioration in terms of walking and eating normally as a result of his dementia.  They will need some time to come to terms with the natural course of his dementia and that is a process that is begining as a result of this hospitalization.       More than 50% of the visit was spent in counseling/coordination of care: Yes  Time Spent: 70 minutes

## 2015-05-30 NOTE — Progress Notes (Signed)
Pt got up in bed and bed alarm went off, PT went in room to assist pt and turned around and pt had already fallen on the floor. Nursing staff went to room to assess pt for injury and take vital signs. No injury noted and vital signs are stable. Pt tried to get up 3 more times and RN got a sitter order from Hospitalist. 1:1 sitter at this time. Fall protocol in place and huddle was conducted with Surveyor, quantity. Pt resting quietly with sitter at bedside with bed alarm on.

## 2015-05-30 NOTE — Evaluation (Signed)
Objective Swallowing Evaluation:  (MBSS)  Patient Details  Name: Jimmy Horn MRN: [id number] Date of Birth: [birth year]  Today's Date: 05/30/2015 Time: SLP Start Time (ACUTE ONLY): 0745-SLP Stop Time (ACUTE ONLY): 0900 SLP Time Calculation (min) (ACUTE ONLY): 75 min  Past Medical History: History reviewed. No pertinent past medical history. Past Surgical History:  Past Surgical History  Procedure Laterality Date  . Coronary artery bypass graft  04/24/2011    Dr. Roxan Hockey  . Appendectomy     HPI:  Other Pertinent Information: This is an 81 y/o male who was admitted to the hospital w/ AKI, hypotension, s/t falls at home. Pt has a baseline of Dementia - appears moderate+ during interaction. Wife cares for pt at home. He has used a walker for ambulation. Pt has been eating but wife noted a decline in appetite. Pt has been agitated when staff have attempted to work w/ him since admission - suspect d/t the Dementia. Pt's diet was modified to a Dys 3 diet w/ Nectar liquids w/ increased risk for aspiration. Pt has baseline wet vocal quality and throat clearing even when not taking po's; Wife reported pt has "always done that for all the years I've known him".  No Data Recorded   Subjective: Patient behavior: (alertness, ability to follow instructions, etc.): awake, agreed to participate in the study  Chief complaint: dysphagia; overt s/s of aspiration at bedside   Objective:  Radiological Procedure: A videoflouroscopic evaluation of oral-preparatory, reflex initiation, and pharyngeal phases of the swallow was performed; as well as a screening of the upper esophageal phase.  I. POSTURE: upright II. VIEW: lateral III. COMPENSATORY STRATEGIES: multiple swallows; small boluses; alternating food/liquid boluses IV. BOLUSES ADMINISTERED:  Thin Liquid: 2 trials  Nectar-thick Liquid: 3 trials  Honey-thick Liquid: NT  Puree: 3 trials  Mechanical Soft: NT V. RESULTS OF EVALUATION: A. ORAL  PREPARATORY PHASE: (The lips, tongue, and velum are observed for strength and coordination)       **Overall Severity Rating: MODERATE. Pt exhibited moderate lingual pumping w/ delayed A-P transit time; premature spillage of bolus material into the pharynx - moreso liquids. Inconsistent, diffuse residue.  B. SWALLOW INITIATION/REFLEX: (The reflex is normal if "triggered" by the time the bolus reached the base of the tongue)  **Overall Severity Rating: MODERATE-SEVERE. Pt exhibited a delayed pharyngeal swallow w/ all trials spilling to the pyriform sinuses as the swallow was initiated; thin liquids tended to fill the pyriform sinuses as the swallow was initiated. Laryngeal penetration and aspiration occurred during the swallowing.  C. PHARYNGEAL PHASE: (Pharyngeal function is normal if the bolus shows rapid, smooth, and continuous transit through the pharynx and there is no pharyngeal residue after the swallow)  **Overall Severity Rating: MODERATE-SEVERE. Pt exhibited decreased laryngeal excursion w/ decreased anterior movement of the arytenoids as well as decreased pharyngeal pressure during the swallow which resulted in MODERATE pharyngeal residue in both the valleculae and pyriform sinuses post swallowing; this residue then spilled into the laryngeal vestibule as penetration/aspriation after the swallow.   D. LARYNGEAL PENETRATION: (Material entering into the laryngeal inlet/vestibule but not aspirated): w/ all consistencies  E. ASPIRATION: w/ residue of all consistencies; thin liquids x1 F. ESOPHAGEAL PHASE: (Screening of the upper esophagus): noted what appeared to be a prominent osteophyte/bony protrusion just below the UES at ~C5-6. This appeared to impede the bolus motility somewhat but did not occlude/block it completely as material was noted to move around it.  G. ASSESSMENT: Pt presents w/ MODERATE-SEVERE Oropharyngeal Phase Dysphagia w/  laryngeal penetration and aspiration occuring during this  study sec. to a moderate-severely delayed pharyngeal swallow initiation suspect d/t pt's Cognitive decline/Dementia. Pt was not completely sensate to the laryngeal penetration/aspiration thus did not cough/throat clear sufficiently in attempt to protect his airway. Silent penetration and aspiration places at pt at HIGH risk for respiratory decline/pneumonia.    Pt exhibited decreased laryngeal excursion w/ decreased anterior movement of the arytenoids as  well as decreased pharyngeal pressure during the swallow which resulted in MODERATE  pharyngeal residue in both the valleculae and pyriform sinuses post swallowing; this residue then  spilled into the laryngeal vestibule as penetration/aspriation after the swallow. During the  Esophageal phase, noted what appeared to be a prominent osteophyte/bony protrusion just below  the UES at ~C5-6. This appeared to impede the bolus motility somewhat but did not occlude/block it  completely as material was noted to move around it. This could have some impact on pharyngeal  phase motility/clearing.    Due to pt's presentation during this study and his High risk for aspiration to occur w/ any po   consistency, pt was rec'd to remain NPO including meds; MD informed and NSG consulted w/ recs.   Also rec'd a Palliative Care consult for discussion of alternative means of feeding as appropriate.   MD agreed.     PLAN/RECOMMENDATIONS:  A. Diet: NPO w/ oral care   B. Swallowing Precautions: aspiration precautions during oral care  C. Recommended consultation to: Palliative Care  D. Therapy recommendations: education as Quarry manager.   E. Results and recommendations were discussed w/ Wife, pt, MD, NSG  Assessment / Plan / Recommendation CHL IP CLINICAL IMPRESSIONS 05/30/2015  Therapy Diagnosis (No Data)  Clinical Impression (None)      CHL IP TREATMENT RECOMMENDATION 05/30/2015  Treatment Recommendations (No Data)     CHL IP DIET RECOMMENDATION 05/30/2015  SLP Diet  Recommendations NPO  Liquid Administration via (None)  Medication Administration (None)  Compensations (None)  Postural Changes and/or Swallow Maneuvers (None)     CHL IP OTHER RECOMMENDATIONS 05/30/2015  Recommended Consults (No Data)  Oral Care Recommendations Oral care BID  Other Recommendations (No Data)     No flowsheet data found.   CHL IP FREQUENCY AND DURATION 05/29/2015  Speech Therapy Frequency (ACUTE ONLY) min 3x week  Treatment Duration 1 week     Pertinent Vitals/Pain denied    SLP Swallow Goals No flowsheet data found.  No flowsheet data found.    CHL IP REASON FOR REFERRAL 05/30/2015  Reason for Referral Objectively evaluate swallowing function     No flowsheet data found.    No flowsheet data found.    No flowsheet data found.  No flowsheet data found.        Orinda Kenner, MS, CCC-SLP  Watson,Katherine 05/30/2015, 12:19 PM

## 2015-05-30 NOTE — Progress Notes (Signed)
Jimmy Horn at [place] NAME: Jimmy Horn    MR#:  [id number]  DATE OF BIRTH:  22-Jan-1931  SUBJECTIVE:  CHIEF COMPLAINT:   Chief Complaint  Patient presents with  . Fall  . Hypotension   the patient is demented.  REVIEW OF SYSTEMS:  Demented, unable to provide review of system.   DRUG ALLERGIES:  No Known Allergies  VITALS:  Blood pressure 125/65, pulse 75, temperature 98.7 F (37.1 C), temperature source Oral, resp. rate 19, height 5\' 9"  (1.753 m), weight 67.858 kg (149 lb 9.6 oz), SpO2 95 %.  PHYSICAL EXAMINATION:  GENERAL:  81 y.o.-year-old patient lying in the bed with no acute distress.  EYES: Pupils equal, round, reactive to light and accommodation. No scleral icterus. Extraocular muscles intact.  HEENT: Head atraumatic, normocephalic. Oropharynx and nasopharynx clear.  NECK:  Supple, no jugular venous distention. No thyroid enlargement, no tenderness.  LUNGS: Normal breath sounds bilaterally, no wheezing, rales,rhonchi or crepitation. No use of accessory muscles of respiration.  CARDIOVASCULAR: S1, S2 normal. No murmurs, rubs, or gallops.  ABDOMEN: Soft, nontender, nondistended. Bowel sounds present. No organomegaly or mass.  EXTREMITIES: No pedal edema, cyanosis, or clubbing.  NEUROLOGIC: Cranial nerves II through XII are intact. Muscle strength 4/5 in all extremities. Sensation intact. Gait not checked.  PSYCHIATRIC:  Demented. SKIN: No obvious rash, lesion, or ulcer.    LABORATORY PANEL:   CBC  Recent Labs Lab 05/28/15 1933  WBC 11.9*  HGB 12.2*  HCT 36.9*  PLT 189   ------------------------------------------------------------------------------------------------------------------  Chemistries   Recent Labs Lab 05/28/15 1933  05/30/15 1018  NA 143  < > 144  K 3.9  < > 3.8  CL 107  < > 111  CO2 26  < > 27  GLUCOSE 112*  < > 96  BUN 43*  < > 27*  CREATININE 3.08*  < > 1.16  CALCIUM 9.1  < > 8.9   AST 20  --   --   ALT 10*  --   --   ALKPHOS 52  --   --   BILITOT 1.3*  --   --   < > = values in this interval not displayed. ------------------------------------------------------------------------------------------------------------------  Cardiac Enzymes No results for input(s): TROPONINI in the last 168 hours. ------------------------------------------------------------------------------------------------------------------  RADIOLOGY:  Dg Pelvis 1-2 Views  05/28/2015   CLINICAL DATA:  Unwitnessed fall.  Altered mental status.  EXAM: PELVIS - 1-2 VIEW  COMPARISON:  None.  FINDINGS: The cortical margins of the bony pelvis are intact. No fracture. Pubic symphysis and sacroiliac joints are congruent. Both femoral heads are well-seated in the respective acetabula. Multifocal of the left optic change. The bones are under mineralized. Brachytherapy seeds in the regional prostate.  IMPRESSION: No evidence of pelvic fracture.   Electronically Signed   By: Jeb Levering M.D.   On: 05/28/2015 21:46   Ct Head Wo Contrast  05/28/2015   CLINICAL DATA:  Two falls at home.  Altered mental status.  EXAM: CT HEAD WITHOUT CONTRAST  CT CERVICAL SPINE WITHOUT CONTRAST  TECHNIQUE: Multidetector CT imaging of the head and cervical spine was performed following the standard protocol without intravenous contrast. Multiplanar CT image reconstructions of the cervical spine were also generated.  COMPARISON:  Head CT 05/12/2015  FINDINGS: CT HEAD FINDINGS  Generalized atrophy and chronic small vessel ischemia, unchanged from prior exam.No intracranial hemorrhage, mass effect, or midline shift. No hydrocephalus. The basilar cisterns are patent. No  evidence of territorial infarct. No intracranial fluid collection. Calvarium is intact. Included paranasal sinuses and mastoid air cells are well aerated.  CT CERVICAL SPINE FINDINGS  There is no fracture. The dens is intact. There are no jumped or perched facets. Large  flowing anterior osteophytes throughout cervical spine. Mild disc space narrowing at C3-C4 and C4-C5. Minimal anterolisthesis of C5 on C6 and C6 on C7 appears degenerative. Minimal flattening at C6 vertebra, chronic. No prevertebral soft tissue edema. Atherosclerosis of the carotid vasculature.  IMPRESSION: 1. No acute intracranial abnormality. Atrophy and chronic small vessel ischemia, stable. 2. No acute fracture or subluxation of cervical spine. Degenerative disc disease. Flowing anterior osteophytes can be seen in the setting of DISH.   Electronically Signed   By: Jeb Levering M.D.   On: 05/28/2015 20:37   Ct Cervical Spine Wo Contrast  05/28/2015   CLINICAL DATA:  Two falls at home.  Altered mental status.  EXAM: CT HEAD WITHOUT CONTRAST  CT CERVICAL SPINE WITHOUT CONTRAST  TECHNIQUE: Multidetector CT imaging of the head and cervical spine was performed following the standard protocol without intravenous contrast. Multiplanar CT image reconstructions of the cervical spine were also generated.  COMPARISON:  Head CT 05/12/2015  FINDINGS: CT HEAD FINDINGS  Generalized atrophy and chronic small vessel ischemia, unchanged from prior exam.No intracranial hemorrhage, mass effect, or midline shift. No hydrocephalus. The basilar cisterns are patent. No evidence of territorial infarct. No intracranial fluid collection. Calvarium is intact. Included paranasal sinuses and mastoid air cells are well aerated.  CT CERVICAL SPINE FINDINGS  There is no fracture. The dens is intact. There are no jumped or perched facets. Large flowing anterior osteophytes throughout cervical spine. Mild disc space narrowing at C3-C4 and C4-C5. Minimal anterolisthesis of C5 on C6 and C6 on C7 appears degenerative. Minimal flattening at C6 vertebra, chronic. No prevertebral soft tissue edema. Atherosclerosis of the carotid vasculature.  IMPRESSION: 1. No acute intracranial abnormality. Atrophy and chronic small vessel ischemia, stable. 2.  No acute fracture or subluxation of cervical spine. Degenerative disc disease. Flowing anterior osteophytes can be seen in the setting of DISH.   Electronically Signed   By: Jeb Levering M.D.   On: 05/28/2015 20:37   US Renal  05/29/2015   CLINICAL DATA:  Acute renal injury. Two falls at home. History of renal cysts. Diminished renal function.  EXAM: RENAL / URINARY TRACT ULTRASOUND COMPLETE  COMPARISON:  Report from CT 08/12/2008, images not available  FINDINGS: Right Kidney:  Length: 11 cm. Multiple cysts in the right kidney. Largest is multilobular in shape measuring 4.6 x 4.8 x 5.1 cm. Additional smaller rounded simple cysts are seen throughout the right kidney. No hydronephrosis visualized. No perinephric fluid collection. No evidence of solid lesion.  Left Kidney:  Length: 10.8 cm. Echogenicity within diffusely increased. Cyst in the interpolar region measures 1.8 x 2.5 x 2.3 cm No mass or hydronephrosis visualized. No perirenal fluid collection.  Bladder:  Appears normal for degree of bladder distention.  IMPRESSION: 1. Bilateral renal cysts. Largest in the right kidney is multilobular in shape and measures 5.1 cm. No evidence for solid lesion. 2. No obstructive uropathy. 3. Mild increased left renal echogenicity, suggestive of chronic medical renal disease.   Electronically Signed   By: Jeb Levering M.D.   On: 05/29/2015 01:32   Dg Chest Port 1 View  05/28/2015   CLINICAL DATA:  Hypotensive, cough.  Congestion.  EXAM: PORTABLE CHEST - 1 VIEW  COMPARISON:  06/26/2011  FINDINGS: Patient is post median sternotomy. The cardiomediastinal contours are normal. Soft tissue fullness in the right paratracheal region is unchanged from prior. Pulmonary vasculature is normal. No consolidation, pleural effusion, or pneumothorax. Skin folds project over the left hemithorax. No acute osseous abnormalities are seen.  IMPRESSION: No acute pulmonary process.   Electronically Signed   By: Jeb Levering M.D.    On: 05/28/2015 20:03    EKG:   Orders placed or performed during the hospital encounter of 05/28/15  . ED EKG 12-Lead  . ED EKG 12-Lead  . EKG 12-Lead  . EKG 12-Lead    ASSESSMENT AND PLAN:   1. Acute kidney injury, due to ATN. Improved.  Continue IV fluid hydration,  hold diuretic and and ACE inhibitor. Renal ultrasound: Bilateral kidney cysts, no obstruction.   2. Essential hypertension. hold lisinopril and HCTZ.  3. Hyperlipidemia unspecified: Pravachol 4. Advanced Dementia: Aricept  * Dysphagia. High risk for aspiration. Nothing by mouth for now. Speech study staff suggested PEG or Palliative care consult. Aspiration and fall precaution.  His wife will discuss with daughters.  I discussed with Dr. Megan Salon about palliative are consult.  Continue PT.  All the records are reviewed and case discussed with Care Management/Social Workerr. Management plans discussed with the patient, his wife and daughters and they are in agreement.  CODE STATUS: Full code  TOTAL TIME TAKING CARE OF THIS PATIENT: 42 minutes.   POSSIBLE D/C IN 2-3 DAYS, DEPENDING ON CLINICAL CONDITION.   Demetrios Loll M.D on 05/30/2015 at 2:51 PM  Between 7am to 6pm - Pager - [phone]  After 6pm go to [email or web address] - password [email or web address] [facility]  [facility]  Office  [phone]  CC: Primary care physician; [facility], Chrissie Noa, MD

## 2015-05-30 NOTE — Plan of Care (Signed)
Problem: Discharge Progression Outcomes Goal: Activity appropriate for discharge plan Outcome: Not Progressing Pt refused to work with PT, he becomes combative and fights with staff. PT was asked to come today and assess pt for SNF placement, awaiting assessment dictation. Wife states " I cannot take care of him at home." Care management aware of Wifes concerns and orders that PT and CNA work with pt because he works better with Males.
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
## 2015-05-31 LAB — GLUCOSE, CAPILLARY
GLUCOSE-CAPILLARY: 88 mg/dL (ref 65–99)
GLUCOSE-CAPILLARY: 102 mg/dL — AB (ref 65–99)

## 2015-05-31 MED ORDER — GLYCOPYRROLATE 1 MG PO TABS
2.0000 mg | ORAL_TABLET | Freq: Three times a day (TID) | ORAL | Status: DC
Start: 2015-05-31 — End: 2015-05-31

## 2015-05-31 NOTE — Progress Notes (Signed)
New referral for [facility] at home after discharge received from [facility] following Palliative care consult. Jimmy Horn is an 81 year old man who lives at home with his wife who is his primary caregiver. He was brought to the [facility] ED following a fall that she was unable to get him up from. She reports more frequent falls. He was found to have hypotension and dehydration resulting in acute kidney injury. His PMH includes Alzheimer's dementia, HTN, HLD, prostate ca, CAD sp CABG and gout. Per chart note review he has had poor oral intake related to his advancing dementia and is at great risk for aspiration. SLP consult recommended NPO based on swallow evaluation on 8/1. Family has met with Palliative Medicine physician Dr. Megan Salon and attending physician Dr. Bridgett Larsson, goals discussed related to feeding tube placement. They have chosen NOT to have feeding tube placed and to pursue comfort with hospice services in the patient's home. Patient to be given pleasure feeds with nectar thick liquids. Mr. Mom has been combative with staff at times and has been unable to participate in physical therapy, he has required a 1:1 sitter present in his hospital room. He remains a high fall risk.   Writer met with patient's wife Marnette Burgess and daughter Vaughan Basta in the family room. Education initiated regarding hospice services, team approach and philosophy of care with good understanding voiced by both. Family requests a FULLY electric hospital bed as patient's wife is elderly. No other DME needs at time of discharge, further needs to be addressed by the hospice admissions nurse. Arrangements need to be made to move furniture to accommodate the hospital bed, will plan for delivery tomorrow morning with discharge home when bed is in place.  Patient will need EMS transport at discharge with signed portable DNR in place. RNCM Hassan Rowan, Staff RN Gerald Stabs and attending physician Dr. Bridgett Larsson all  made aware of discharge plan. Information faxed to referral intake. Hospice information and contact number left with patient's daughter Vaughan Basta. Thank you for the opportunity to be involved in caring for [name]. Flo Shanks RN, BSN, [place] [facility] [place], [facility] [phone] c

## 2015-05-31 NOTE — Plan of Care (Signed)
Problem: Discharge Progression Outcomes Goal: Discharge plan in place and appropriate Individualism Outcome: Progressing Pt has mumbled speech. Has Air cabin crew. Goal: Other Discharge Outcomes/Goals Outcome: Progressing Plan of care progress to goal: Pain - pt appears to have no pain this shift Hemodynamically stable - VSS, BP little elevated Diet - pt NPO after failing 2 swallow tests Activity - pt remains in bed

## 2015-05-31 NOTE — Plan of Care (Addendum)
Problem: Discharge Progression Outcomes Goal: Other Discharge Outcomes/Goals Outcome: Progressing Patient had no c/o pain today  Safety sitter at bedside  [facility] Palliative Care saw patient today and family has decided on going home with Hospice discharge is planned for tomorrow  Patient was placed on a Dsyphagia III nectar thick diet

## 2015-05-31 NOTE — Discharge Instructions (Signed)
Thin liquid diet, pleasure food. Aspiration and fall precaution. Activity as tolerated.  Hospice care.

## 2015-05-31 NOTE — Progress Notes (Signed)
Speech Therapy note: reviewed chart notes, met w/ wife. Family/pt have elected to not do alternative means of feeding at this time. Pt is scheduled to d/c home w/ Hospice services per chart notes and CM. MD is addressing a pleasure oral diet for pt. No further skilled ST services indicated at this time. NSG to reconsult if any change in status or needs. Wife agreed.

## 2015-05-31 NOTE — Consult Note (Signed)
Palliative Medicine Inpatient Consult Follow Up Note   Name: Jimmy Horn Date: 05/31/2015 MRN: [id number]  DOB: Oct 02, 1931  Referring Physician: Demetrios Loll, MD  Palliative Care consult requested for this 81 y.o. male for goals of medical therapy in patient who lives at home who has dementia and was found in the ER to have signs of dehydration due to decreased oral intake which was felt to be due to advancing dementia with loss of appetite. He was brought in because he had had two falls and he was found to be hypotensive and was given fluids for the acute kidney injury. Diuretics and Ace Inhibitor were held. He was noted to have poor management of oral secretions and has had an ST evaluation. He has been NPO after MBSS showed significant dysphagia, but family is aware that a feeding tube will not stop his aspiration of saliva into his lungs, etc.  They have been thinking about decisions to make at this point of end stage dementia.    TODAY'S DISCUSSIONS AND PLAN: 1)  The family is in agreement that his code status should be DNR and his code status is changed to DNR today. 2)  They also agree with [facility].  The patient is not felt to be one who could have skilled care (unless he had a feeding tube).  He does not follow instructions or recall them when PT works with him, so he cannot benefit from PT and as such cannot receive skilled services.  Family does not want him in a facility and they don't want him to have a feeding tube at this time.  [facility]ice [facility] the Home is something they do agree with. 3)  He will need a hospital bed, and other arrangements need to be made, so discharge will likely be tomorrow instead of today.  I will work on the DC meds in the am before his planned discharge and Dr. Bridgett Larsson will do the DC summary.    REVIEW OF SYSTEMS:  Patient is not able to provide ROS  CODE STATUS: DNR --as of this morning when family opted for this after talking with me yesterday and  with care mgr this am.    PAST MEDICAL HISTORY: Alzheimers Dementia Essential HTN CAD  Renal Cysts Dyslipidemia   PAST SURGICAL HISTORY:  Past Surgical History  Procedure Laterality Date  . Coronary artery bypass graft  04/24/2011    Dr. Roxan Hockey  . Appendectomy      Vital Signs: BP 123/62 mmHg  Pulse 66  Temp(Src) 97.5 F (36.4 C) (Axillary)  Resp 18  Ht 5\' 9"  (1.753 m)  Wt 67.858 kg (149 lb 9.6 oz)  BMI 22.08 kg/m2  SpO2 100% Filed Weights   05/28/15 1914 05/29/15 0013  Weight: 68.04 kg (150 lb) 67.858 kg (149 lb 9.6 oz)    Estimated body mass index is 22.08 kg/(m^2) as calculated from the following:   Height as of this encounter: 5\' 9"  (1.753 m).   Weight as of this encounter: 67.858 kg (149 lb 9.6 oz).  PHYSICAL EXAM: NAD --more rested in bed now  EOMI OP clear  Neck w/o JVD or TM Hrt rrr no mgr Lungs with some occasional ronchi but otw clear Abd soft and NT Skin no mottling or open areas Neuro --he asks me nonsensical questions   LABS: CBC:    Component Value Date/Time   WBC 11.9* 05/28/2015 1933   WBC 10.5 11/21/2014 1003   HGB 12.2* 05/28/2015 1933  HGB 13.0 11/21/2014 1003   HCT 36.9* 05/28/2015 1933   HCT 39.3* 11/21/2014 1003   PLT 189 05/28/2015 1933   PLT 252 11/21/2014 1003   MCV 85.4 05/28/2015 1933   MCV 86 11/21/2014 1003   NEUTROABS 9.6* 05/28/2015 1933   NEUTROABS 8.1* 11/21/2014 1003   LYMPHSABS 1.3 05/28/2015 1933   LYMPHSABS 1.7 11/21/2014 1003   MONOABS 0.9 05/28/2015 1933   MONOABS 0.6 11/21/2014 1003   EOSABS 0.0 05/28/2015 1933   EOSABS 0.1 11/21/2014 1003   BASOSABS 0.0 05/28/2015 1933   BASOSABS 0.0 11/21/2014 1003   Comprehensive Metabolic Panel:    Component Value Date/Time   NA 144 05/30/2015 1018   NA 143 11/21/2014 1003   K 3.8 05/30/2015 1018   K 3.9 11/21/2014 1003   CL 111 05/30/2015 1018   CL 108* 11/21/2014 1003   CO2 27 05/30/2015 1018   CO2 28 11/21/2014 1003   BUN 27* 05/30/2015 1018   BUN  24* 11/21/2014 1003   CREATININE 1.16 05/30/2015 1018   CREATININE 1.11 11/21/2014 1003   GLUCOSE 96 05/30/2015 1018   GLUCOSE 117* 11/21/2014 1003   CALCIUM 8.9 05/30/2015 1018   CALCIUM 8.6 11/21/2014 1003   AST 20 05/28/2015 1933   AST 24 11/21/2014 1003   ALT 10* 05/28/2015 1933   ALT 13* 11/21/2014 1003   ALKPHOS 52 05/28/2015 1933   ALKPHOS 68 11/21/2014 1003   BILITOT 1.3* 05/28/2015 1933   BILITOT 0.8 11/21/2014 1003   PROT 6.4* 05/28/2015 1933   PROT 7.1 11/21/2014 1003   ALBUMIN 3.7 05/28/2015 1933   ALBUMIN 3.3* 11/21/2014 1003    IMPRESSION: 1) Acute kidney Injury due to dehydration  --due to poor oral intake related to appetite loss from dementia  --possibly also related to worsening dysphagia due to his advanced dementia --possibly also related to patient's wife having some issues understanding or remembering how to manage and monitor his oral intake, etc 2) Alzheimer's type Dementia  --on Aricept --Now falling and has significatn DYSPHAGIA ----Hence he is at advanced stage 2) Renal cysts --seen on ultrasound (not newly discovered apparently) 3) History of CAD 4) Dyslipidemia 5) HTN (on diuretics and ACE-I normally (now held due to his presentation) 6) Mild Protein Calorie Malnutrition  SEE ABOVE FOR PLAN:   REFERRALS TO BE ORDERED:  Hospice for [facility]   More than 50% of the visit was spent in counseling/coordination of care: YES  Time Spent:  50 min

## 2015-05-31 NOTE — Progress Notes (Signed)
   05/31/15 1300  Clinical Encounter Type  Visited With Patient and family together  Visit Type Initial  visited with patient and family in patient room.  Family said everything was going fine and they would call us if needed.  Offered pastoral presence and support.  [place] [phone]

## 2015-05-31 NOTE — Progress Notes (Signed)
[place] at [place] NAME: Lenus Trauger    MR#:  [id number]  DATE OF BIRTH:  1931/02/14  SUBJECTIVE:  CHIEF COMPLAINT:   Chief Complaint  Patient presents with  . Fall  . Hypotension   the patient is demented. He fell without injury yesterday.  REVIEW OF SYSTEMS:  Demented, unable to provide review of system.   DRUG ALLERGIES:  No Known Allergies  VITALS:  Blood pressure 121/68, pulse 73, temperature 98.4 F (36.9 C), temperature source Axillary, resp. rate 20, height 5\' 9"  (1.753 m), weight 67.858 kg (149 lb 9.6 oz), SpO2 100 %.  PHYSICAL EXAMINATION:  GENERAL:  81 y.o.-year-old patient lying in the bed with no acute distress.  EYES: Pupils equal, round, reactive to light and accommodation. No scleral icterus. Extraocular muscles intact.  HEENT: Head atraumatic, normocephalic. Oropharynx and nasopharynx clear.  NECK:  Supple, no jugular venous distention. No thyroid enlargement, no tenderness.  LUNGS: Normal breath sounds bilaterally, no wheezing, rales,rhonchi or crepitation. No use of accessory muscles of respiration.  CARDIOVASCULAR: S1, S2 normal. No murmurs, rubs, or gallops.  ABDOMEN: Soft, nontender, nondistended. Bowel sounds present. No organomegaly or mass.  EXTREMITIES: No pedal edema, cyanosis, or clubbing.  NEUROLOGIC:  He did not follow commands and did not answer questions. PSYCHIATRIC:  Demented. SKIN: No obvious rash, lesion, or ulcer.    LABORATORY PANEL:   CBC  Recent Labs Lab 05/28/15 1933  WBC 11.9*  HGB 12.2*  HCT 36.9*  PLT 189   ------------------------------------------------------------------------------------------------------------------  Chemistries   Recent Labs Lab 05/28/15 1933  05/30/15 1018  NA 143  < > 144  K 3.9  < > 3.8  CL 107  < > 111  CO2 26  < > 27  GLUCOSE 112*  < > 96  BUN 43*  < > 27*  CREATININE 3.08*  < > 1.16  CALCIUM 9.1  < > 8.9  AST 20  --   --    ALT 10*  --   --   ALKPHOS 52  --   --   BILITOT 1.3*  --   --   < > = values in this interval not displayed. ------------------------------------------------------------------------------------------------------------------  Cardiac Enzymes No results for input(s): TROPONINI in the last 168 hours. ------------------------------------------------------------------------------------------------------------------  RADIOLOGY:  No results found.  EKG:   Orders placed or performed during the hospital encounter of 05/28/15  . ED EKG 12-Lead  . ED EKG 12-Lead  . EKG 12-Lead  . EKG 12-Lead    ASSESSMENT AND PLAN:   1. Acute kidney injury, due to ATN. Improved.  discontinue IV fluid hydration,  hold diuretic and and ACE inhibitor. Renal ultrasound: Bilateral kidney cysts, no obstruction.   2. Essential hypertension. hold lisinopril and HCTZ.  3. Hyperlipidemia unspecified: discontinue Pravachol 4. Advanced Dementia: Aricept  * Dysphagia. High risk for aspiration. Aspiration and fall precaution. Speech study staff suggested PEG or Palliative care consult.  The patient's daughter and wife don't want PEG tube placement. Speech study staff and hospice. Suggested pleasure diet.  The patient's wife and daughter decided DO NOT RESUSCITATE.  I discussed with Dr. Megan Salon and hospice staff.  The patient will be discharged to home with hospice care tomorrow.  All the records are reviewed and case discussed with Care Management/Social Workerr. Management plans discussed with the patient, his wife and daughter and they are in agreement.  CODE STATUS: DNR.  TOTAL TIME TAKING CARE OF THIS PATIENT: 45 minutes.  POSSIBLE D/C IN tomorrow, DEPENDING ON CLINICAL CONDITION.   Demetrios Loll M.D on 05/31/2015 at 2:42 PM  Between 7am to 6pm - Pager - [phone]  After 6pm go to [email or web address] - password [email or web address] [facility]  [place] [facility]  Office  [phone]  CC: Primary care  physician; [facility], Chrissie Noa, MD

## 2015-06-01 MED ORDER — SENNA 8.6 MG PO TABS: 1.0000 | ORAL_TABLET | Freq: Every day | ORAL | Status: AC

## 2015-06-01 MED ORDER — HALOPERIDOL 5 MG PO TABS: 5.0000 mg | ORAL_TABLET | Freq: Three times a day (TID) | ORAL | Status: AC | PRN

## 2015-06-01 MED ORDER — PROCHLORPERAZINE MALEATE 10 MG PO TABS
10.0000 mg | ORAL_TABLET | Freq: Four times a day (QID) | ORAL | Status: AC | PRN
Start: 2015-06-01 — End: ?

## 2015-06-01 MED ORDER — MORPHINE SULFATE (CONCENTRATE) 10 MG /0.5 ML PO SOLN: 5.0000 mg | ORAL | Status: AC | PRN

## 2015-06-01 NOTE — Progress Notes (Signed)
Follow up visit made on new referral for Hospice Per chart review patient ate 10% of breakfast this morning.Writer confirmed with patient's daughter Vaughan Basta that the hospital bed was delivered, [facility] Hassan Rowan, Staff RN Velna Hatchet, attending physician Dr. Bridgett Larsson and Palliative Care physician Dr. Megan Salon all made aware. Plan remains for patient to discharge today via EMS with portable DNR in place. Planned admission to hospice services today at family request. Thank you. Flo Shanks RN, BSN, [place] [facility] [place], [facility] [phone] c

## 2015-06-01 NOTE — Progress Notes (Signed)
Speech Language Pathology Treatment: Dysphagia  Patient Details Name: Jimmy Horn MRN: [id number] DOB: Jul 01, 1931 Today's Date: 06/01/2015 Time: 5248-1859 SLP Time Calculation (min) (ACUTE ONLY): 43 min  Assessment / Plan / Recommendation Clinical Impression  Pt presents w/ increased risk for aspiration w/ oral intake; see MBSS results. Pt presented w/ consistent delayed, mild throat clearing w/ small, single trials of Nectar liquids via cup. Met w/ wife and thoroughly discussed aspiration precautions and suggestions for foods/diet/preparation. Encouraged wife to call on the Nurses coming to the home through Hospice services; wife agreed. Pt is d/t discharge home w/ Hospice services today. ST will f/u w/ any further education until then. NSG updated. Supplies and handouts given.   HPI Other Pertinent Information: This is an 81 y/o male who was admitted to the hospital w/ AKI, hypotension, s/t falls at home. Pt has a baseline of Dementia - appears moderate+ during interaction. Wife cares for pt at home. He has used a walker for ambulation. Pt has been eating but wife noted a decline in appetite. Pt has been agitated when staff have attempted to work w/ him since admission - suspect d/t the Dementia. Pt's diet was modified to a Dys 3 diet w/ Nectar liquids w/ increased risk for aspiration. Pt has baseline wet vocal quality and throat clearing even when not taking po's; Wife reported pt has "always done that for all the years I've known him". MBSS revealed pt has oropharyngeal phase dysphagia attributed to delayed pharyngeal swallow initiation(suspect d/t Dementia) and an osteophyte in the upper Esphagus. Pt is at high risk for aspiration; s/s aspiration is noted at bedside w/ pleasure po's per staff and wife.    Pertinent Vitals Pain Assessment:  (none noted during interaction)  SLP Plan  Discharge SLP treatment due to (comment) (pt appears at his baseline; d/c'ing home w/ Hospice svc.)     Recommendations Diet recommendations: Dysphagia 3 (mechanical soft);Nectar-thick liquid Liquids provided via: Cup Medication Administration: Crushed with puree Supervision: Full supervision/cueing for compensatory strategies;Trained caregiver to feed patient Compensations: Slow rate;Small sips/bites;Check for pocketing Postural Changes and/or Swallow Maneuvers: Seated upright 90 degrees (reduce distractions)              Oral Care Recommendations: Oral care BID Follow up Recommendations:  (NSG svcs at the home through Hospice) Plan: Discharge SLP treatment due to (comment) (pt appears at his baseline; d/c'ing home w/ Hospice svc.)    [place], MS, CCC-SLP  Jimmy Horn 06/01/2015, 12:08 PM

## 2015-06-01 NOTE — Plan of Care (Signed)
Problem: Discharge Progression Outcomes Goal: Other Discharge Outcomes/Goals Outcome: Adequate for Discharge Pt is alert but confused, irritable, on room air, stable vital signs, sitter at bedside, pt is d/c to home with hospice, prescriptions provided to wife, reviewed d/c instructions with wife, resting in between care, poor appetite, no evidence of pain. Pt is transported to home via EMS. Uneventful shift.

## 2015-06-01 NOTE — Care Management Important Message (Signed)
Important Message  Patient Details  Name: Jimmy Horn MRN: [id number] Date of Birth: 02/22/1931   Medicare Important Message Given:  Yes-third notification given    [name] 06/01/2015, 9:41 AM

## 2015-06-01 NOTE — Discharge Summary (Signed)
[place] at [place] NAME: Jimmy Horn    MR#:  [id number]  DATE OF BIRTH:  11/16/1930  DATE OF ADMISSION:  05/28/2015 ADMITTING PHYSICIAN: Lytle Butte, MD  DATE OF DISCHARGE: 06/01/2015  2:36 PM  PRIMARY CARE PHYSICIAN: FELDPAUSCH, DALE E, MD    ADMISSION DIAGNOSIS:  Acute kidney injury [N17.9] Hypotension, unspecified hypotension type [I95.9] Acute renal failure, unspecified acute renal failure type [N17.9]   DISCHARGE DIAGNOSIS:   ARF Dysphagia Advanced dementia SECONDARY DIAGNOSIS:  History reviewed. No pertinent past medical history.  HOSPITAL COURSE:   1. Acute kidney injury, due to ATN. Improved after treatment with IV fluid rehydration. We hold diuretic and and ACE inhibitor. Renal ultrasound: Bilateral kidney cysts, no obstruction.   2. Essential hypertension. Blood pressure is controlled. hold lisinopril and HCTZ.  3. Hyperlipidemia unspecified: discontinue Pravachol due to dysphagia and poor oral intake. 4. Advanced Dementia: Aricept  * Dysphagia. High risk for aspiration. Aspiration and fall precaution. Speech study staff suggested PEG or Palliative care consult.  The patient's daughter and wife don't want PEG tube placement. Speech study staff and hospice. Suggested pleasure diet.  The patient's wife and daughter decided DO NOT RESUSCITATE.  I discussed with Dr. Megan Salon and hospice staff.  The patient is discharged to home with hospice care today.  DISCHARGE CONDITIONS:   Stable. Discharged to home with hospice care today.  CONSULTS OBTAINED:  Treatment Team:  Lytle Butte, MD  DRUG ALLERGIES:  No Known Allergies  DISCHARGE MEDICATIONS:   Discharge Medication List as of 06/01/2015  1:11 PM    START taking these medications   Details  haloperidol (HALDOL) 5 MG tablet Take 1 tablet (5 mg total) by mouth every 8 (eight) hours as needed for agitation (crush in applesause or pureed foods).,  Starting 06/01/2015, Until Discontinued, Normal    Morphine Sulfate (MORPHINE CONCENTRATE) 10 mg / 0.5 ml concentrated solution Take 0.25 mLs (5 mg total) by mouth every 4 (four) hours as needed for severe pain., Starting 06/01/2015, Until Discontinued, Normal    prochlorperazine (COMPAZINE) 10 MG tablet Take 1 tablet (10 mg total) by mouth every 6 (six) hours as needed for nausea or vomiting., Starting 06/01/2015, Until Discontinued, Normal    senna (SENOKOT) 8.6 MG TABS tablet Take 1 tablet (8.6 mg total) by mouth daily., Starting 06/01/2015, Until Discontinued, Normal      STOP taking these medications     aspirin EC 81 MG tablet      colchicine 0.6 MG tablet      donepezil (ARICEPT) 10 MG tablet      lisinopril-hydrochlorothiazide (PRINZIDE,ZESTORETIC) 20-12.5 MG per tablet      pravastatin (PRAVACHOL) 40 MG tablet      QUEtiapine (SEROQUEL) 25 MG tablet          DISCHARGE INSTRUCTIONS:    If you experience worsening of your admission symptoms, develop shortness of breath, life threatening emergency, suicidal or homicidal thoughts you must seek medical attention immediately by calling 911 or calling your MD immediately  if symptoms less severe.  You Must read complete instructions/literature along with all the possible adverse reactions/side effects for all the Medicines you take and that have been prescribed to you. Take any new Medicines after you have completely understood and accept all the possible adverse reactions/side effects.   Please note  You were cared for by a hospitalist during your hospital stay. If you have any questions about your discharge medications or  the care you received while you were in the hospital after you are discharged, you can call the unit and asked to speak with the hospitalist on call if the hospitalist that took care of you is not available. Once you are discharged, your primary care physician will handle any further medical issues. Please note that  NO REFILLS for any discharge medications will be authorized once you are discharged, as it is imperative that you return to your primary care physician (or establish a relationship with a primary care physician if you do not have one) for your aftercare needs so that they can reassess your need for medications and monitor your lab values.    Today   SUBJECTIVE    demented   VITAL SIGNS:  Blood pressure 174/89, pulse 88, temperature 98.4 F (36.9 C), temperature source Axillary, resp. rate 16, height 5\' 9"  (1.753 m), weight 67.858 kg (149 lb 9.6 oz), SpO2 100 %.  I/O:   Intake/Output Summary (Last 24 hours) at 06/01/15 1502 Last data filed at 06/01/15 1149  Gross per 24 hour  Intake    390 ml  Output      0 ml  Net    390 ml    PHYSICAL EXAMINATION:  GENERAL:  81 y.o.-year-old patient lying in the bed with no acute distress.  EYES: Pupils equal, round, reactive to light and accommodation. No scleral icterus. Extraocular muscles intact.  HEENT: Head atraumatic, normocephalic.  unable to examOropharynx and nasopharynx.  NECK:  Supple, no jugular venous distention. No thyroid enlargement, no tenderness.  LUNGS: Normal breath sounds bilaterally, no wheezing, rales,rhonchi or crepitation. No use of accessory muscles of respiration.  CARDIOVASCULAR: S1, S2 normal. No murmurs, rubs, or gallops.  ABDOMEN: Soft, non-tender, non-distended. Bowel sounds present. No organomegaly or mass.  EXTREMITIES: No pedal edema, cyanosis, or clubbing.  NEURO: unable to exam. The patient did not follow commands. SYCHIATRIC:  demented and noncommunicative.  SKIN: No obvious rash, lesion, or ulcer.   DATA REVIEW:   CBC  Recent Labs Lab 05/28/15 1933  WBC 11.9*  HGB 12.2*  HCT 36.9*  PLT 189    Chemistries   Recent Labs Lab 05/28/15 1933  05/30/15 1018  NA 143  < > 144  K 3.9  < > 3.8  CL 107  < > 111  CO2 26  < > 27  GLUCOSE 112*  < > 96  BUN 43*  < > 27*  CREATININE 3.08*  < >  1.16  CALCIUM 9.1  < > 8.9  AST 20  --   --   ALT 10*  --   --   ALKPHOS 52  --   --   BILITOT 1.3*  --   --   < > = values in this interval not displayed.  Cardiac Enzymes No results for input(s): TROPONINI in the last 168 hours.  Microbiology Results  Results for orders placed or performed during the hospital encounter of 05/28/15  Blood Culture (routine x 2)     Status: None (Preliminary result)   Collection Time: 05/28/15  7:33 PM  Result Value Ref Range Status   Specimen Description BLOOD RIGHT WRIST  Final   Special Requests BAA,ANA,AER,5ML  Final   Culture NO GROWTH 4 DAYS  Final   Report Status PENDING  Incomplete  Blood Culture (routine x 2)     Status: None (Preliminary result)   Collection Time: 05/28/15  7:33 PM  Result Value Ref Range Status  Specimen Description BLOOD LEFT WRIST  Final   Special Requests BAA,ANA,AER5ML  Final   Culture NO GROWTH 4 DAYS  Final   Report Status PENDING  Incomplete  Urine culture     Status: None   Collection Time: 05/28/15 10:07 PM  Result Value Ref Range Status   Specimen Description URINE, CLEAN CATCH  Final   Special Requests NONE  Final   Culture NO GROWTH 1 DAY  Final   Report Status 05/30/2015 FINAL  Final    RADIOLOGY:  No results found.      Management plans discussed with the patient, family and they are in agreement.  CODE STATUS:     Code Status Orders        Start     Ordered   05/31/15 1118  Do not attempt resuscitation (DNR)   Continuous    Question Answer Comment  In the event of cardiac or respiratory ARREST Do not call a "code blue"   In the event of cardiac or respiratory ARREST Do not perform Intubation, CPR, defibrillation or ACLS   In the event of cardiac or respiratory ARREST Use medication by any route, position, wound care, and other measures to relive pain and suffering. May use oxygen, suction and manual treatment of airway obstruction as needed for comfort.      05/31/15 1117       TOTAL TIME TAKING CARE OF THIS PATIENT: 36  minutes.    Demetrios Loll M.D on 06/01/2015 at 3:02 PM  Between 7am to 6pm - Pager - [phone]  After 6pm go to [email or web address] - password [email or web address] [facility]  [place] [facility]  Office  [phone]  CC: Primary care physician; [facility], Chrissie Noa, MD

## 2015-06-01 NOTE — Plan of Care (Signed)
Problem: Discharge Progression Outcomes Goal: Discharge plan in place and appropriate Individualism Outcome: Progressing Pt has mumbled speech. Has Air cabin crew. Goal: Other Discharge Outcomes/Goals Outcome: Progressing Plan of care progress to goal: Pain - pt expressed the need for pain medication, rated with PAINAID scale Hemodynamically stable - VSS Diet - pt NPO Activity - pt remains in bed

## 2015-06-02 LAB — CULTURE, BLOOD (ROUTINE X 2)
Culture: NO GROWTH
Culture: NO GROWTH

## 2016-12-01 IMAGING — CT CT HEAD W/O CM
1 of 2 series · 14 of 30 positions shown, 18 images · non-contrast
Comparison: MRI 08/31/2011.  CT 06/26/2011

CLINICAL DATA: Memory loss

EXAM:
CT HEAD WITHOUT CONTRAST
TECHNIQUE: Contiguous axial images were obtained from the base of the skull
through the vertex without intravenous contrast.

[Series 5: head wo recon · axial · 0.36mm/px · z∈[-5,+122]mm · 14 of 32 slices shown, 18 images]
[im 3/32  brain]
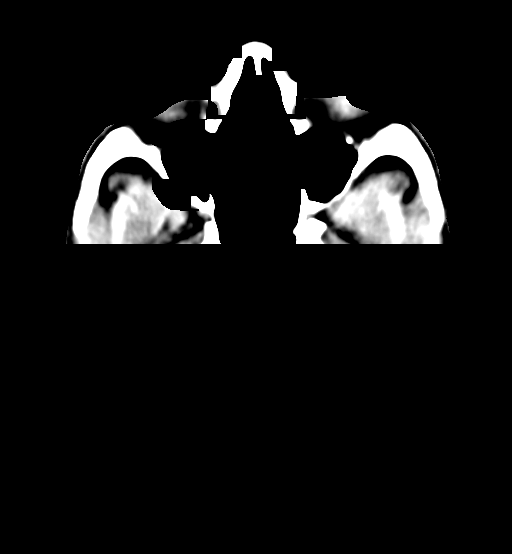
[im 3/32  bone]
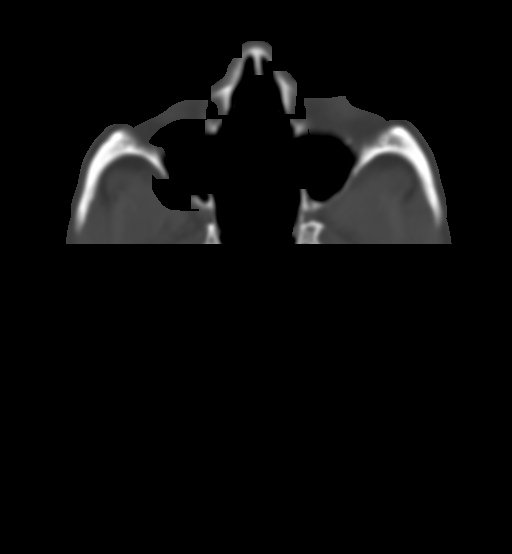
[im 5/32  brain]
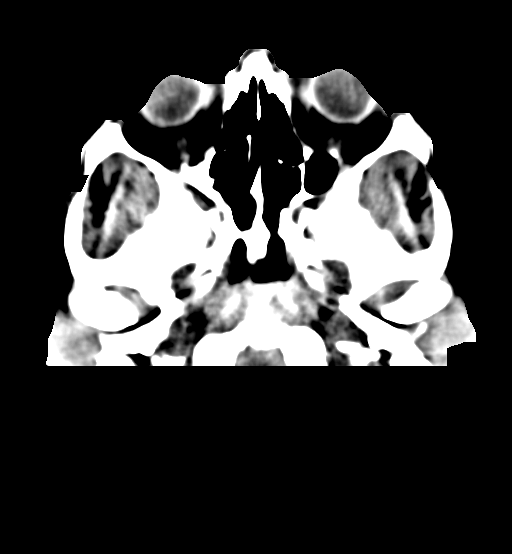
[im 7/32  brain]
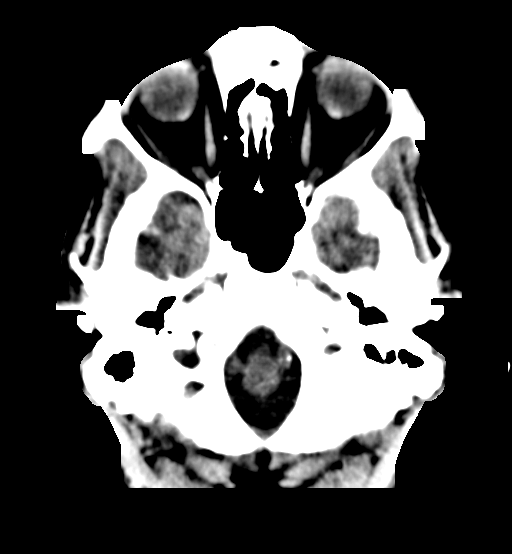
[im 9/32  brain]
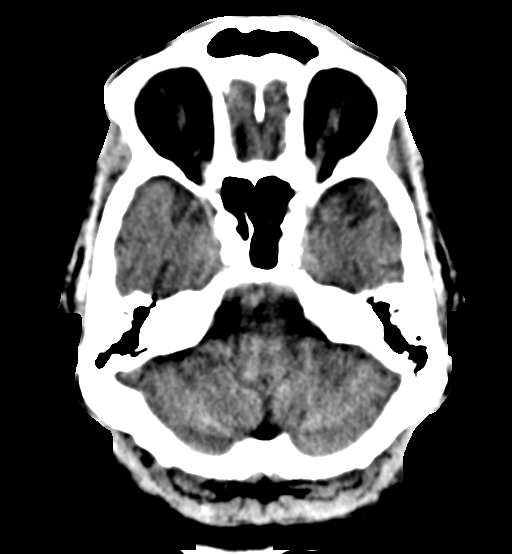
[im 11/32  brain]
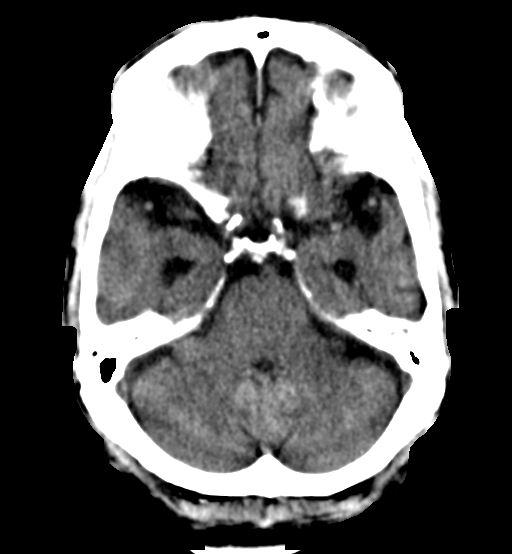
[im 11/32  bone]
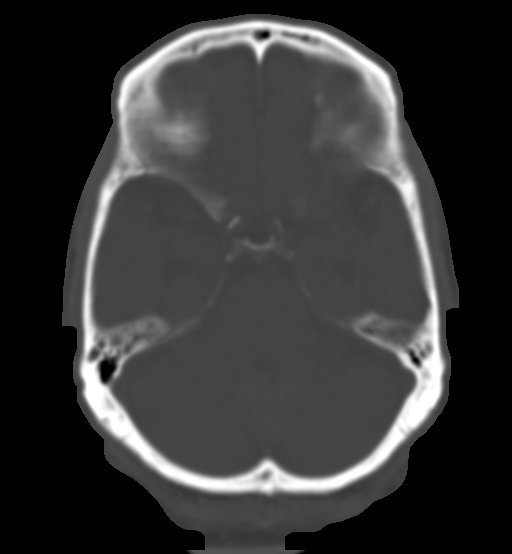
[im 13/32  brain]
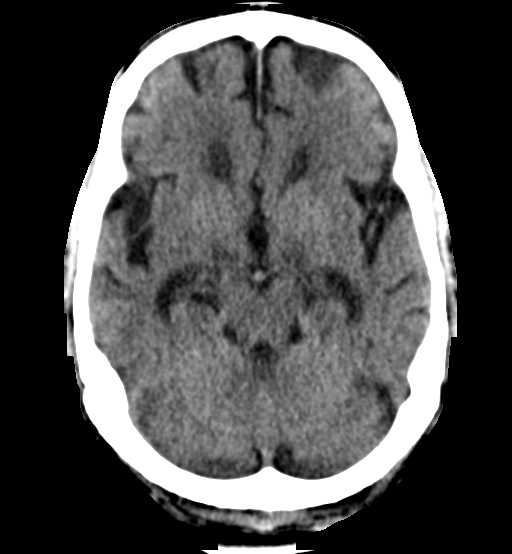
[im 15/32  brain]
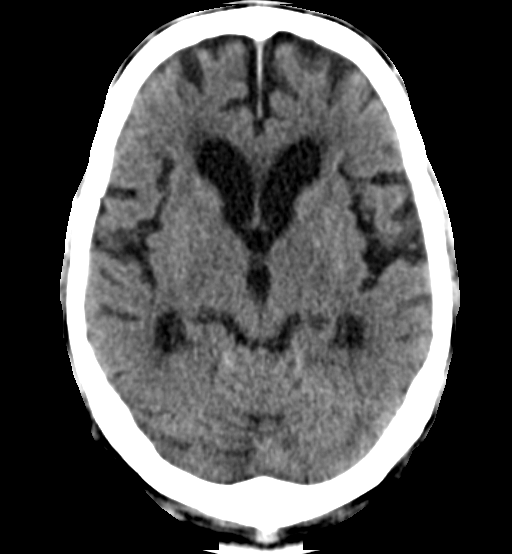
[im 17/32  brain]
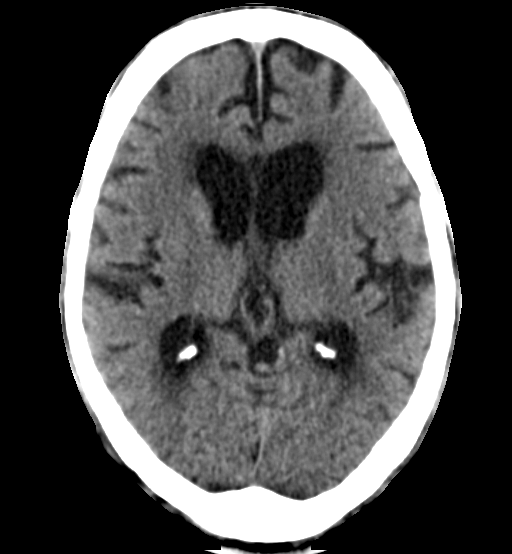
[im 19/32  brain]
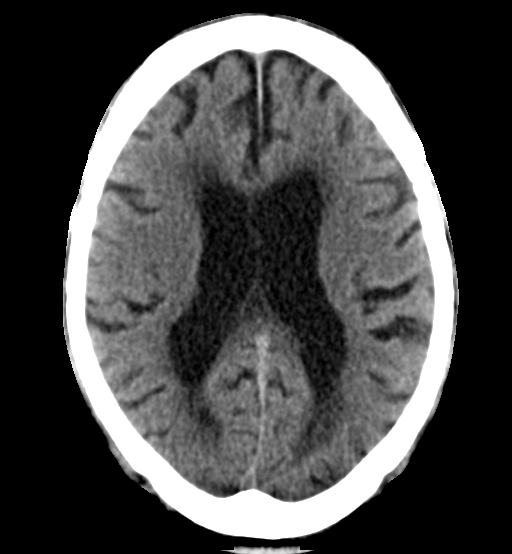
[im 19/32  bone]
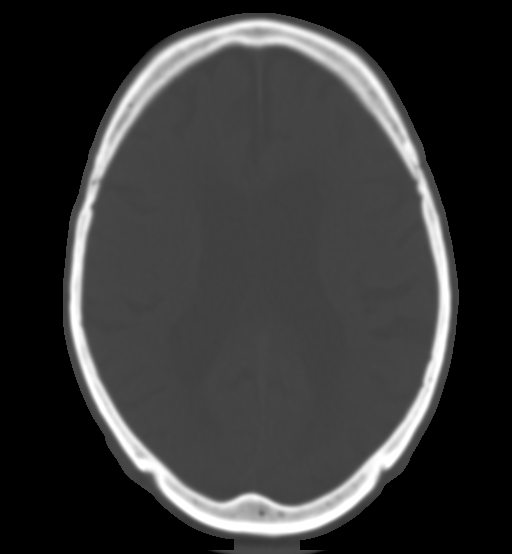
[im 21/32  brain]
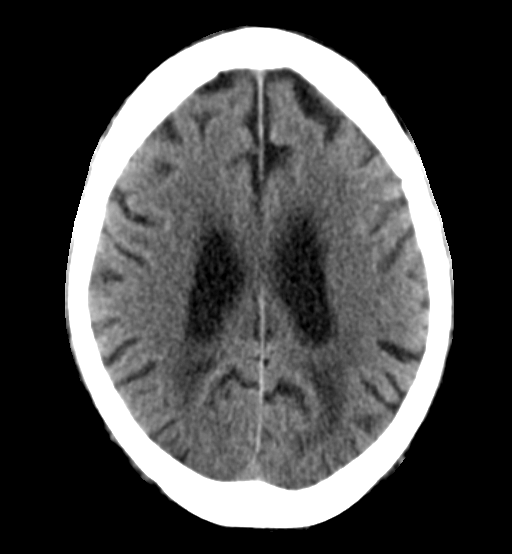
[im 23/32  brain]
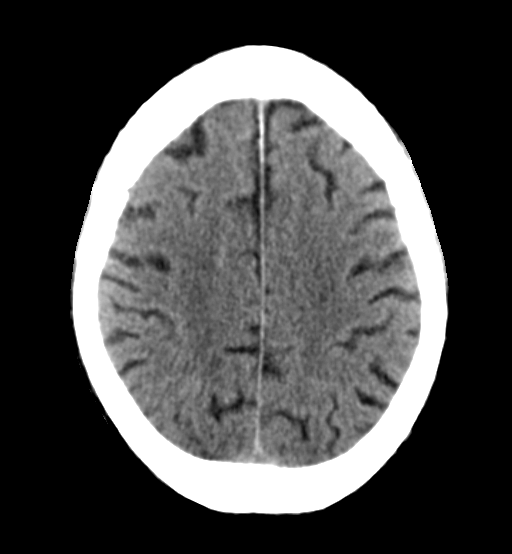
[im 25/32  brain]
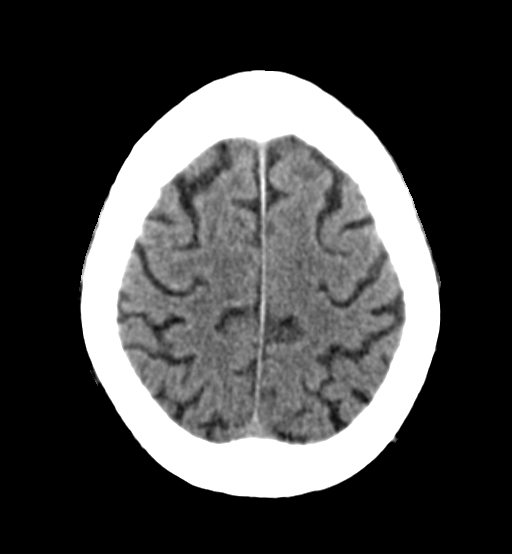
[im 27/32  brain]
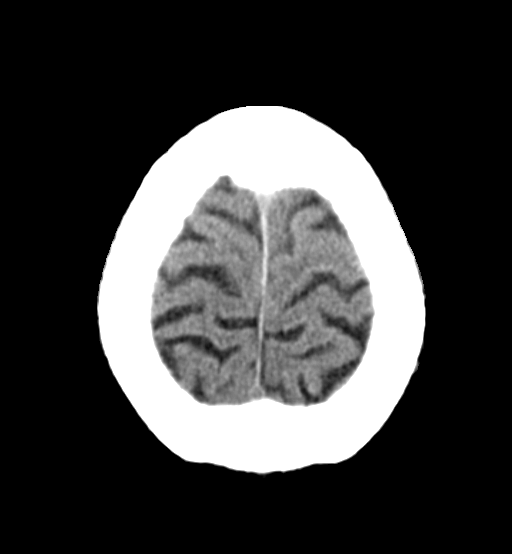
[im 27/32  bone]
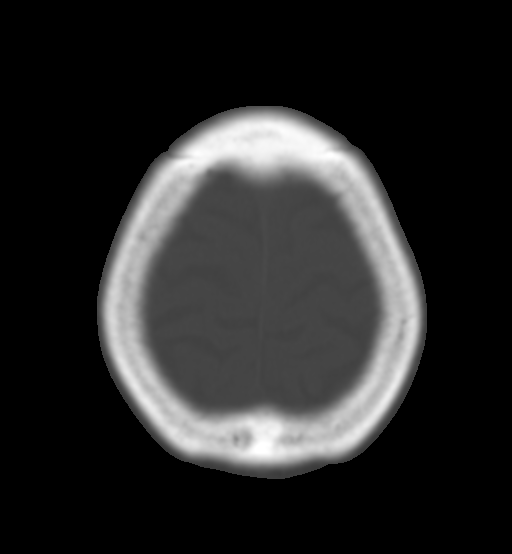
[im 29/32  brain]
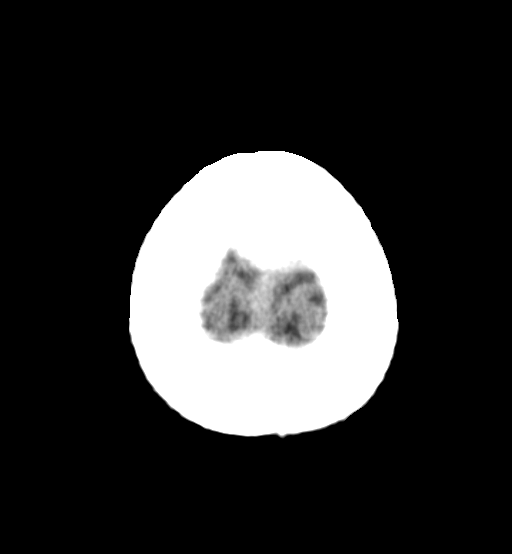

[14 of 30 positions shown; findings below may reference images not displayed]

FINDINGS: Progression of generalized atrophy since 3773. Progression of
chronic microvascular ischemic changes in the white matter.

Negative for acute infarct. Negative for hemorrhage or mass. No
edema or shift of the midline structures.

Calvarium is intact.
IMPRESSION: Progression of atrophy and chronic microvascular ischemia. No acute
abnormality.
